# Patient Record
Sex: Female | Born: 1955 | Race: Black or African American | Hispanic: No | Smoking: Never smoker
Health system: Southern US, Community
[De-identification: ages and names within clinical notes are randomized; demographics above are authoritative.]

## PROBLEM LIST (undated history)

## (undated) DIAGNOSIS — G939 Disorder of brain, unspecified: Secondary | ICD-10-CM

## (undated) DIAGNOSIS — F4310 Post-traumatic stress disorder, unspecified: Secondary | ICD-10-CM

## (undated) DIAGNOSIS — M1990 Unspecified osteoarthritis, unspecified site: Secondary | ICD-10-CM

## (undated) HISTORY — PX: TUBAL LIGATION: SHX77

## (undated) HISTORY — PX: KNEE SURGERY: SHX244

---

## 2007-06-05 ENCOUNTER — Emergency Department (HOSPITAL_COMMUNITY): Admission: EM | Admit: 2007-06-05 | Discharge: 2007-06-05 | Payer: Self-pay | Admitting: Emergency Medicine

## 2007-06-07 ENCOUNTER — Emergency Department (HOSPITAL_COMMUNITY): Admission: EM | Admit: 2007-06-07 | Discharge: 2007-06-07 | Payer: Self-pay | Admitting: Emergency Medicine

## 2007-07-08 ENCOUNTER — Emergency Department (HOSPITAL_COMMUNITY): Admission: EM | Admit: 2007-07-08 | Discharge: 2007-07-08 | Payer: Self-pay | Admitting: Family Medicine

## 2009-05-26 ENCOUNTER — Emergency Department (HOSPITAL_COMMUNITY): Admission: EM | Admit: 2009-05-26 | Discharge: 2009-05-26 | Payer: Self-pay | Admitting: Emergency Medicine

## 2009-11-02 ENCOUNTER — Emergency Department (HOSPITAL_COMMUNITY): Admission: EM | Admit: 2009-11-02 | Discharge: 2009-11-02 | Payer: Self-pay | Admitting: Emergency Medicine

## 2010-04-05 ENCOUNTER — Emergency Department (HOSPITAL_COMMUNITY)
Admission: EM | Admit: 2010-04-05 | Discharge: 2010-04-05 | Payer: Self-pay | Source: Home / Self Care | Admitting: Emergency Medicine

## 2011-01-17 LAB — I-STAT 8, (EC8 V) (CONVERTED LAB)
BUN: 7
Chloride: 104
Glucose, Bld: 113 — ABNORMAL HIGH
Hemoglobin: 15.6 — ABNORMAL HIGH
Potassium: 3.7
Sodium: 137
TCO2: 30

## 2011-01-17 LAB — CBC
Hemoglobin: 13.2
RBC: 5.02
WBC: 7.1

## 2011-01-17 LAB — POCT I-STAT CREATININE
Creatinine, Ser: 1.1
Operator id: 235561

## 2011-01-17 LAB — DIFFERENTIAL
Lymphocytes Relative: 22
Lymphs Abs: 1.5
Monocytes Absolute: 0.6
Monocytes Relative: 9
Neutro Abs: 4.7
Neutrophils Relative %: 66

## 2011-01-17 LAB — INFLUENZA A AND B ANTIGEN (CONVERTED LAB): Inflenza A Ag: NEGATIVE

## 2011-09-05 ENCOUNTER — Emergency Department (HOSPITAL_COMMUNITY)
Admission: EM | Admit: 2011-09-05 | Discharge: 2011-09-05 | Disposition: A | Discharge status: Home / Self Care | Payer: Medicare Other | Attending: Emergency Medicine | Admitting: Emergency Medicine

## 2011-09-05 ENCOUNTER — Encounter (HOSPITAL_COMMUNITY): Payer: Self-pay | Admitting: Emergency Medicine

## 2011-09-05 DIAGNOSIS — R110 Nausea: Secondary | ICD-10-CM | POA: Insufficient documentation

## 2011-09-05 DIAGNOSIS — G43909 Migraine, unspecified, not intractable, without status migrainosus: Secondary | ICD-10-CM | POA: Insufficient documentation

## 2011-09-05 DIAGNOSIS — G939 Disorder of brain, unspecified: Secondary | ICD-10-CM | POA: Insufficient documentation

## 2011-09-05 HISTORY — DX: Disorder of brain, unspecified: G93.9

## 2011-09-05 HISTORY — DX: Unspecified osteoarthritis, unspecified site: M19.90

## 2011-09-05 MED ORDER — METOCLOPRAMIDE HCL 5 MG/ML IJ SOLN
10.0000 mg | Freq: Once | INTRAMUSCULAR | Status: AC
Start: 2011-09-05 — End: 2011-09-05
  Administered 2011-09-05: 10 mg via INTRAMUSCULAR
  Filled 2011-09-05: qty 2

## 2011-09-05 MED ORDER — DEXAMETHASONE SODIUM PHOSPHATE 4 MG/ML IJ SOLN
10.0000 mg | Freq: Once | INTRAMUSCULAR | Status: AC
  Administered 2011-09-05: 10 mg via INTRAMUSCULAR
  Filled 2011-09-05: qty 1

## 2011-09-05 MED ORDER — DIPHENHYDRAMINE HCL 50 MG/ML IJ SOLN
50.0000 mg | Freq: Once | INTRAMUSCULAR | Status: AC
  Administered 2011-09-05: 50 mg via INTRAMUSCULAR
  Filled 2011-09-05: qty 1

## 2011-09-05 NOTE — ED Provider Notes (Signed)
History   This chart was scribed for Laray Anger, DO by Melba Coon. The patient was seen in room STRE7/STRE7 and the patient's care was started at 1335.   CSN: [id number]  Arrival date & time 09/05/11  1229   None     Chief Complaint  Patient presents with  . Headache    HPI Carmen Farrell is a 56 y.o. female who presents to the Emergency Department complaining of gradual onset and persistence of constant acute flair of her chronic right sided migraine headache for the past 3 days.  Describes the headache as per her usual chronic migraine headache pain pattern for the past 4-5 years.  Has been assoc with nausea.  Denies headache was sudden or maximal in onset or at any time.  Denies visual changes, no focal motor weakness, no tingling/numbness in extremities, no fevers, no neck pain, no rash.     Past Medical History  Diagnosis Date  . Migraine   . Arthritis   . Brain lesion     History reviewed. No pertinent past surgical history.   History  Substance Use Topics  . Smoking status: Never Smoker   . Smokeless tobacco: Not on file  . Alcohol Use: No      Review of Systems 10 Systems reviewed and all are negative for acute change except as noted in the HPI. ROS: Statement: All systems negative except as marked or noted in the HPI; Constitutional: Negative for fever and chills. ; ; Eyes: Negative for eye pain, redness and discharge. ; ; ENMT: Negative for ear pain, hoarseness, nasal congestion, sinus pressure and sore throat. ; ; Cardiovascular: Negative for chest pain, palpitations, diaphoresis, dyspnea and peripheral edema. ; ; Respiratory: Negative for cough, wheezing and stridor. ; ; Gastrointestinal: +nausea. Negative for vomiting, diarrhea, abdominal pain, blood in stool, hematemesis, jaundice and rectal bleeding. . ; ; Genitourinary: Negative for dysuria, flank pain and hematuria. ; ; Musculoskeletal: Negative for back pain and neck pain. Negative for  swelling and trauma.; ; Skin: Negative for pruritus, rash, abrasions, blisters, bruising and skin lesion.; ; Neuro: +headache. Negative for lightheadedness and neck stiffness. Negative for weakness, altered level of consciousness , altered mental status, extremity weakness, paresthesias, involuntary movement, seizure and syncope.       Allergies  Talwin and Penicillins  Home Medications   Current Outpatient Rx  Name Route Sig Dispense Refill  . CETIRIZINE HCL 10 MG PO TABS Oral Take 10 mg by mouth daily.    Marland Kitchen VITAMIN D 1000 UNITS PO TABS Oral Take 1,000 Units by mouth daily.    Marland Kitchen FLUTICASONE PROPIONATE 50 MCG/ACT NA SUSP Nasal Place 1 spray into the nose daily.    Marland Kitchen GABAPENTIN 800 MG PO TABS Oral Take 800 mg by mouth 3 (three) times daily.    Marland Kitchen GARLIC PO Oral Take 1 capsule by mouth daily.    . OMEGA-3-ACID ETHYL ESTERS 1 G PO CAPS Oral Take 1 g by mouth daily.    . IMITREX PO Oral Take by mouth.      BP 118/84  Pulse 72  Temp(Src) 98.3 F (36.8 C) (Oral)  Resp 20  SpO2 98%  Physical Exam 1340: Physical examination:  Nursing notes reviewed; Vital signs and O2 SAT reviewed;  Constitutional: Well developed, Well nourished, Well hydrated, In no acute distress; Head:  Normocephalic, atraumatic; Eyes: EOMI, PERRL, No scleral icterus; ENMT: TM's clear, +edemetous nasal turbinates bilat with clear rhinorrhea. Mouth and pharynx normal, Mucous membranes  moist; Neck: Supple, Full range of motion, No lymphadenopathy; Cardiovascular: Regular rate and rhythm, No murmur, rub, or gallop; Respiratory: Breath sounds clear & equal bilaterally, No rales, rhonchi, wheezes, or rub, Normal respiratory effort/excursion; Chest: Nontender, Movement normal; Extremities: Pulses normal, No tenderness, No edema, No calf edema or asymmetry.; Neuro: AA&Ox3, Major CN grossly intact. Speech clear, no facial droop.  Strength 5/5 equal bilat UE's and LE's.  DTR 2/4 equal bilat UE's and LE's.  No gross sensory deficits.   Normal coordination bilat UE's and LE's. No nystagmus. Gait steady.; Skin: Color normal, Warm, Dry, no rash.    ED Course  Procedures   MDM  MDM Reviewed: previous chart, nursing note and vitals    1:57 PM:  Has hx chronic right sided forehead headaches, has been eval in ED previously for same.  Endorses she also has been extensively eval by her PMD and Neuro MD for same.  Describes the pain as being "above my eye," denies specific eye or vision complaints.  Denies any change in her usual chronic pain pattern today.  Will tx symptomatically.      I personally performed the services described in this documentation, which was scribed in my presence. The recorded information has been reviewed and considered. Gavino Fouch Allison Quarry, DO 09/06/11 2130

## 2011-09-05 NOTE — Discharge Instructions (Signed)
RESOURCE GUIDE  Dental Problems  Patients with Medicaid: [facility]                     [facility] [address]                                           [address] Phone:  [phone]                                                  Phone:  [phone]  If unable to pay or uninsured, contact:  [facility] or [facility] to become qualified for the adult dental clinic.  Chronic Pain Problems Contact [facility]  [phone] Patients need to be referred by their primary care doctor.  Insufficient Money for Medicine Contact [employer]:  call "211" or [facility] 271-5999.  No Primary Care Doctor Call [facility]  832-8000 Other agencies that provide inexpensive medical care    [facility]  [phone]    [facility]  [phone]    [facility]  [phone]    Women[facility] Clinic  [phone]    [facility]  [phone]    [facility]  [phone]  Psychological Services [facility]  [phone] [facility]  [phone] [facility]   [phone] (emergency services [phone])  Substance Abuse Resources [facility]  336-882-2125 [facility] 336-784-9470 [facility] 336-285-9073 [facility] 336-845-3988 Residential & Outpatient Substance Abuse Program  800-659-3381  Abuse/Neglect [place] Child Abuse Hotline (336) 641-3795 [place] Child Abuse Hotline 800-378-5315 (After Hours)  Emergency Shelter [facility] (336) 271-5985  Maternity Homes [facility] (336) 275-9566 [facility] (704) 372-4663  MRSA Hotline #:   832-7006    [place] [facility]  [facility] [place]     [employer]                          [facility] [address] [place]                       [address]      [address]  [place]                                                [place]                            [place] Phone:  [phone]                                   Phone:  [phone]                 Phone:  [phone]  [facility] Phone:  [phone]    [facility] Child Abuse Hotline 409-163-2835 4380657311 (After Hours)   Take your usual prescriptions as previously directed.  Keep a diary of your headaches and show it to your doctor at your next follow up visit.  Call your regular medical doctor and your Neurologist today to schedule a follow up appointment within the next week.  Return to the Emergency Department immediately sooner if worsening.

## 2011-09-05 NOTE — ED Notes (Signed)
Pt c/o left sided HA with blurry vision and nausea x 3 days; pt sts hx of same in past

## 2011-12-31 ENCOUNTER — Encounter (HOSPITAL_COMMUNITY): Payer: Self-pay | Admitting: Emergency Medicine

## 2011-12-31 ENCOUNTER — Emergency Department (HOSPITAL_COMMUNITY)
Admission: EM | Admit: 2011-12-31 | Discharge: 2012-01-01 | Disposition: A | Discharge status: Home / Self Care | Payer: Medicare Other | Attending: Emergency Medicine | Admitting: Emergency Medicine

## 2011-12-31 DIAGNOSIS — T148XXA Other injury of unspecified body region, initial encounter: Secondary | ICD-10-CM | POA: Insufficient documentation

## 2011-12-31 DIAGNOSIS — X58XXXA Exposure to other specified factors, initial encounter: Secondary | ICD-10-CM | POA: Insufficient documentation
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
## 2012-01-29 ENCOUNTER — Emergency Department (HOSPITAL_COMMUNITY): Payer: No Typology Code available for payment source

## 2012-01-29 ENCOUNTER — Encounter (HOSPITAL_COMMUNITY): Payer: Self-pay

## 2012-01-29 ENCOUNTER — Emergency Department (HOSPITAL_COMMUNITY)
Admission: EM | Admit: 2012-01-29 | Discharge: 2012-01-29 | Disposition: A | Discharge status: Home / Self Care | Payer: No Typology Code available for payment source | Attending: Emergency Medicine | Admitting: Emergency Medicine

## 2012-01-29 DIAGNOSIS — F4310 Post-traumatic stress disorder, unspecified: Secondary | ICD-10-CM | POA: Insufficient documentation

## 2012-01-29 DIAGNOSIS — M25569 Pain in unspecified knee: Secondary | ICD-10-CM | POA: Insufficient documentation

## 2012-01-29 DIAGNOSIS — Z79899 Other long term (current) drug therapy: Secondary | ICD-10-CM | POA: Insufficient documentation

## 2012-01-29 DIAGNOSIS — IMO0002 Reserved for concepts with insufficient information to code with codable children: Secondary | ICD-10-CM | POA: Insufficient documentation

## 2012-01-29 DIAGNOSIS — M25529 Pain in unspecified elbow: Secondary | ICD-10-CM | POA: Insufficient documentation

## 2012-01-29 DIAGNOSIS — M25559 Pain in unspecified hip: Secondary | ICD-10-CM | POA: Insufficient documentation

## 2012-01-29 DIAGNOSIS — G43909 Migraine, unspecified, not intractable, without status migrainosus: Secondary | ICD-10-CM | POA: Insufficient documentation

## 2012-01-29 HISTORY — DX: Post-traumatic stress disorder, unspecified: F43.10

## 2012-01-29 MED ORDER — HYDROCODONE-ACETAMINOPHEN 5-325 MG PO TABS
1.0000 | ORAL_TABLET | Freq: Once | ORAL | Status: AC
  Administered 2012-01-29: 1 via ORAL
  Filled 2012-01-29: qty 1

## 2012-01-29 MED ORDER — KETOROLAC TROMETHAMINE 60 MG/2ML IM SOLN
60.0000 mg | Freq: Once | INTRAMUSCULAR | Status: AC
  Administered 2012-01-29: 60 mg via INTRAMUSCULAR
  Filled 2012-01-29: qty 2

## 2012-01-29 MED ORDER — TRAMADOL HCL 50 MG PO TABS: 50.0000 mg | ORAL_TABLET | Freq: Four times a day (QID) | ORAL | Status: DC | PRN

## 2012-01-29 MED ORDER — NAPROXEN 500 MG PO TABS: 500.0000 mg | ORAL_TABLET | Freq: Two times a day (BID) | ORAL | Status: DC

## 2012-01-29 MED ORDER — LORAZEPAM 1 MG PO TABS
1.0000 mg | ORAL_TABLET | Freq: Once | ORAL | Status: AC
  Administered 2012-01-29: 1 mg via ORAL
  Filled 2012-01-29: qty 1

## 2012-01-29 NOTE — ED Notes (Signed)
Patient transported to X-ray 

## 2012-01-29 NOTE — ED Provider Notes (Signed)
Medical screening examination/treatment/procedure(s) were performed by non-physician practitioner and as supervising physician I was immediately available for consultation/collaboration.   [place] Skalicky, MD 01/29/12 2335 

## 2012-01-29 NOTE — ED Notes (Signed)
Ortho tech notified.  

## 2012-01-29 NOTE — ED Notes (Signed)
Patient unable to ambulate independently  
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
## 2012-01-30 ENCOUNTER — Encounter (HOSPITAL_COMMUNITY): Payer: Self-pay | Admitting: Emergency Medicine

## 2012-01-30 ENCOUNTER — Emergency Department (HOSPITAL_COMMUNITY)
Admission: EM | Admit: 2012-01-30 | Discharge: 2012-01-30 | Disposition: A | Discharge status: Home / Self Care | Payer: Medicare Other | Attending: Emergency Medicine | Admitting: Emergency Medicine

## 2012-01-30 DIAGNOSIS — Z043 Encounter for examination and observation following other accident: Secondary | ICD-10-CM | POA: Insufficient documentation

## 2012-01-30 DIAGNOSIS — M129 Arthropathy, unspecified: Secondary | ICD-10-CM | POA: Insufficient documentation

## 2012-01-30 DIAGNOSIS — M25562 Pain in left knee: Secondary | ICD-10-CM

## 2012-01-30 DIAGNOSIS — Z79899 Other long term (current) drug therapy: Secondary | ICD-10-CM | POA: Insufficient documentation

## 2012-01-30 DIAGNOSIS — F4310 Post-traumatic stress disorder, unspecified: Secondary | ICD-10-CM | POA: Insufficient documentation

## 2012-01-30 MED ORDER — OXYCODONE-ACETAMINOPHEN 5-325 MG PO TABS: 1.0000 | ORAL_TABLET | Freq: Four times a day (QID) | ORAL | Status: DC | PRN

## 2012-01-30 NOTE — Discharge Instructions (Signed)
Please make sure to follow up with an orthopedist as discussed.  Return for any concerning changes in your condition
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
## 2012-01-30 NOTE — ED Notes (Signed)
Was seen yesterday and given meds but she needs more has leg and hip pain

## 2012-07-20 ENCOUNTER — Emergency Department (HOSPITAL_COMMUNITY)
Admission: EM | Admit: 2012-07-20 | Discharge: 2012-07-20 | Disposition: A | Discharge status: Home / Self Care | Payer: No Typology Code available for payment source | Attending: Emergency Medicine | Admitting: Emergency Medicine

## 2012-07-20 DIAGNOSIS — Z8739 Personal history of other diseases of the musculoskeletal system and connective tissue: Secondary | ICD-10-CM | POA: Insufficient documentation

## 2012-07-20 DIAGNOSIS — R112 Nausea with vomiting, unspecified: Secondary | ICD-10-CM | POA: Insufficient documentation

## 2012-07-20 DIAGNOSIS — Z8659 Personal history of other mental and behavioral disorders: Secondary | ICD-10-CM | POA: Insufficient documentation

## 2012-07-20 DIAGNOSIS — Z9889 Other specified postprocedural states: Secondary | ICD-10-CM | POA: Insufficient documentation

## 2012-07-20 DIAGNOSIS — Z87828 Personal history of other (healed) physical injury and trauma: Secondary | ICD-10-CM | POA: Insufficient documentation

## 2012-07-20 DIAGNOSIS — Z8669 Personal history of other diseases of the nervous system and sense organs: Secondary | ICD-10-CM | POA: Insufficient documentation

## 2012-07-20 DIAGNOSIS — Z79899 Other long term (current) drug therapy: Secondary | ICD-10-CM | POA: Insufficient documentation

## 2012-07-20 DIAGNOSIS — R748 Abnormal levels of other serum enzymes: Secondary | ICD-10-CM

## 2012-07-20 DIAGNOSIS — G43909 Migraine, unspecified, not intractable, without status migrainosus: Secondary | ICD-10-CM

## 2012-07-20 LAB — URINALYSIS, ROUTINE W REFLEX MICROSCOPIC
Glucose, UA: NEGATIVE mg/dL
Protein, ur: NEGATIVE mg/dL
Specific Gravity, Urine: 1.019 (ref 1.005–1.030)
Urobilinogen, UA: 0.2 mg/dL (ref 0.0–1.0)

## 2012-07-20 LAB — CBC WITH DIFFERENTIAL/PLATELET
Basophils Absolute: 0 10*3/uL (ref 0.0–0.1)
Basophils Relative: 0 % (ref 0–1)
Eosinophils Absolute: 0.1 10*3/uL (ref 0.0–0.7)
Eosinophils Relative: 1 % (ref 0–5)
HCT: 36.7 % (ref 36.0–46.0)
Lymphocytes Relative: 21 % (ref 12–46)
MCH: 25.6 pg — ABNORMAL LOW (ref 26.0–34.0)
MCHC: 31.9 g/dL (ref 30.0–36.0)
MCV: 80.3 fL (ref 78.0–100.0)
Monocytes Absolute: 0.7 10*3/uL (ref 0.1–1.0)
Platelets: 347 10*3/uL (ref 150–400)
RDW: 15.7 % — ABNORMAL HIGH (ref 11.5–15.5)

## 2012-07-20 LAB — COMPREHENSIVE METABOLIC PANEL
AST: 20 U/L (ref 0–37)
CO2: 27 mEq/L (ref 19–32)
Calcium: 9.2 mg/dL (ref 8.4–10.5)
Creatinine, Ser: 0.68 mg/dL (ref 0.50–1.10)
GFR calc non Af Amer: >90 mL/min (ref >90)
Total Protein: 8 g/dL (ref 6.0–8.3)

## 2012-07-20 LAB — URINE MICROSCOPIC-ADD ON

## 2012-07-20 MED ORDER — ONDANSETRON 8 MG PO TBDP: 8.0000 mg | ORAL_TABLET | Freq: Three times a day (TID) | ORAL | Status: DC | PRN

## 2012-07-20 MED ORDER — METOCLOPRAMIDE HCL 5 MG/ML IJ SOLN
10.0000 mg | Freq: Once | INTRAMUSCULAR | Status: AC
  Administered 2012-07-20: 10 mg via INTRAVENOUS
  Filled 2012-07-20: qty 2

## 2012-07-20 MED ORDER — OXYCODONE-ACETAMINOPHEN 5-325 MG PO TABS: 1.0000 | ORAL_TABLET | Freq: Four times a day (QID) | ORAL | Status: DC | PRN

## 2012-07-20 MED ORDER — HYDROMORPHONE HCL PF 1 MG/ML IJ SOLN
1.0000 mg | Freq: Once | INTRAMUSCULAR | Status: AC
  Administered 2012-07-20: 1 mg via INTRAVENOUS
  Filled 2012-07-20: qty 1

## 2012-07-20 MED ORDER — SODIUM CHLORIDE 0.9 % IV BOLUS (SEPSIS)
1000.0000 mL | Freq: Once | INTRAVENOUS | Status: AC
  Administered 2012-07-20: 1000 mL via INTRAVENOUS

## 2012-07-20 MED ORDER — DIPHENHYDRAMINE HCL 50 MG/ML IJ SOLN
12.5000 mg | Freq: Once | INTRAMUSCULAR | Status: AC
  Administered 2012-07-20: 12.5 mg via INTRAVENOUS
  Filled 2012-07-20: qty 1

## 2012-07-20 NOTE — ED Notes (Signed)
Pt states that she has had a HA since this morning w/ n/v. 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 Unable to keep imitrex down. Also had knee arthoscopy last week.
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
## 2012-07-20 NOTE — ED Notes (Signed)
[id number]<AV> Expected date:07/20/12<BR> Expected time: 7:49 PM<BR> Means of arrival:Ambulance<BR> Comments:<BR> headache

## 2013-02-07 ENCOUNTER — Encounter (HOSPITAL_COMMUNITY): Payer: Self-pay | Admitting: Emergency Medicine

## 2013-02-07 ENCOUNTER — Emergency Department (HOSPITAL_COMMUNITY): Payer: Non-veteran care

## 2013-02-07 ENCOUNTER — Emergency Department (HOSPITAL_COMMUNITY)
Admission: EM | Admit: 2013-02-07 | Discharge: 2013-02-07 | Disposition: A | Discharge status: Home / Self Care | Payer: Non-veteran care | Attending: Emergency Medicine | Admitting: Emergency Medicine

## 2013-02-07 DIAGNOSIS — J069 Acute upper respiratory infection, unspecified: Secondary | ICD-10-CM | POA: Insufficient documentation

## 2013-02-07 DIAGNOSIS — R05 Cough: Secondary | ICD-10-CM | POA: Diagnosis present

## 2013-02-07 DIAGNOSIS — J029 Acute pharyngitis, unspecified: Secondary | ICD-10-CM | POA: Insufficient documentation

## 2013-02-07 DIAGNOSIS — R112 Nausea with vomiting, unspecified: Secondary | ICD-10-CM | POA: Diagnosis not present

## 2013-02-07 DIAGNOSIS — G43909 Migraine, unspecified, not intractable, without status migrainosus: Secondary | ICD-10-CM | POA: Insufficient documentation

## 2013-02-07 DIAGNOSIS — Z79899 Other long term (current) drug therapy: Secondary | ICD-10-CM | POA: Insufficient documentation

## 2013-02-07 DIAGNOSIS — Z8739 Personal history of other diseases of the musculoskeletal system and connective tissue: Secondary | ICD-10-CM | POA: Insufficient documentation

## 2013-02-07 DIAGNOSIS — Z8659 Personal history of other mental and behavioral disorders: Secondary | ICD-10-CM | POA: Insufficient documentation

## 2013-02-07 DIAGNOSIS — IMO0002 Reserved for concepts with insufficient information to code with codable children: Secondary | ICD-10-CM | POA: Insufficient documentation

## 2013-02-07 DIAGNOSIS — Z880 Allergy status to penicillin: Secondary | ICD-10-CM | POA: Insufficient documentation

## 2013-02-07 MED ORDER — DOXYCYCLINE HYCLATE 100 MG PO CAPS: 100.0000 mg | ORAL_CAPSULE | Freq: Two times a day (BID) | ORAL | Status: AC

## 2013-02-07 MED ORDER — DIPHENHYDRAMINE HCL 50 MG/ML IJ SOLN
25.0000 mg | Freq: Once | INTRAMUSCULAR | Status: AC
  Administered 2013-02-07: 25 mg via INTRAVENOUS
  Filled 2013-02-07: qty 1

## 2013-02-07 MED ORDER — DEXAMETHASONE SODIUM PHOSPHATE 10 MG/ML IJ SOLN
10.0000 mg | Freq: Once | INTRAMUSCULAR | Status: AC
  Administered 2013-02-07: 10 mg via INTRAVENOUS
  Filled 2013-02-07: qty 1

## 2013-02-07 MED ORDER — METOCLOPRAMIDE HCL 5 MG/ML IJ SOLN
10.0000 mg | Freq: Once | INTRAMUSCULAR | Status: AC
  Administered 2013-02-07: 10 mg via INTRAVENOUS
  Filled 2013-02-07: qty 2

## 2013-02-07 MED ORDER — SODIUM CHLORIDE 0.9 % IV BOLUS (SEPSIS)
1000.0000 mL | Freq: Once | INTRAVENOUS | Status: AC
  Administered 2013-02-07: 1000 mL via INTRAVENOUS

## 2013-02-07 NOTE — ED Notes (Signed)
Pt sts she is ready to go, her HA has decreased enough for her.

## 2013-02-07 NOTE — ED Notes (Signed)
Pt in radiology 

## 2013-02-07 NOTE — ED Notes (Signed)
Pt reports about a week ago she started to have chills and then a small headache, wasn't too concern at first and tried to treat it at home, sts she also started to experience some wheezing and coughing up mucus. Pt c/o headache to front of head that has progressively gotten worse. sts she has a lesion in her brain and is scheduled to have an MRI done, but doesn't think this is related. Reports some sob, has taken neb treatments at home and been using an humidifier. Pt in nad, skin warm and dry, resp e/u.

## 2013-02-07 NOTE — ED Provider Notes (Signed)
CSN: [id number]     Arrival date & time 02/07/13  1102 History   First MD Initiated Contact with Patient 02/07/13 1152     Chief Complaint  Patient presents with  . Cough  . Wheezing   (Consider location/radiation/quality/duration/timing/severity/associated sxs/prior Treatment) HPI Comments: 57 y/o female with history of migraines presenting with 2 days of cough, headache, and subjective fever. Cough productive of green sputum. Headache in frontal region. No neurological complaints. Also reports nasal drainage and nausea.   The history is provided by the patient. No language interpreter was used.    Past Medical History  Diagnosis Date  . Migraine   . Arthritis   . Brain lesion   . PTSD (post-traumatic stress disorder)    Past Surgical History  Procedure Laterality Date  . Tubal ligation     No family history on file. History  Substance Use Topics  . Smoking status: Never Smoker   . Smokeless tobacco: Not on file  . Alcohol Use: No   OB History   Grav Para Term Preterm Abortions TAB SAB Ect Mult Living                 Review of Systems  Constitutional: Positive for fever, chills and appetite change.  HENT: Positive for congestion, sinus pressure and sore throat. Negative for trouble swallowing and voice change.   Respiratory: Positive for cough, chest tightness and wheezing.   Cardiovascular: Negative for chest pain and palpitations.  Gastrointestinal: Positive for nausea and vomiting. Negative for abdominal pain, diarrhea and abdominal distention.  Genitourinary: Negative for dysuria.  All other systems reviewed and are negative.    Allergies  Talwin; Hydrocodone-acetaminophen; Penicillins; and Tylenol  Home Medications   Current Outpatient Rx  Name  Route  Sig  Dispense  Refill  . albuterol (PROVENTIL HFA;VENTOLIN HFA) 108 (90 BASE) MCG/ACT inhaler   Inhalation   Inhale 2 puffs into the lungs every 6 (six) hours as needed. For shortness of breath          . albuterol-ipratropium (COMBIVENT) 18-103 MCG/ACT inhaler   Inhalation   Inhale 2 puffs into the lungs every 6 (six) hours as needed (wheezing).         . cetirizine (ZYRTEC) 10 MG tablet   Oral   Take 10 mg by mouth daily as needed. For allergies         . cholecalciferol (VITAMIN D) 1000 UNITS tablet   Oral   Take 1,000 Units by mouth daily.         Marland Kitchen etodolac (LODINE) 500 MG tablet   Oral   Take 500 mg by mouth 2 (two) times daily.         . fluticasone (FLONASE) 50 MCG/ACT nasal spray   Nasal   Place 1 spray into the nose daily.         . fluticasone (FLOVENT HFA) 44 MCG/ACT inhaler   Inhalation   Inhale 2 puffs into the lungs 2 (two) times daily.         Marland Kitchen gabapentin (NEURONTIN) 800 MG tablet   Oral   Take 800 mg by mouth 3 (three) times daily.         . metoprolol succinate (TOPROL-XL) 50 MG 24 hr tablet   Oral   Take 50 mg by mouth 2 (two) times daily. Take with or immediately following a meal.         . mometasone-formoterol (DULERA) 100-5 MCG/ACT AERO   Inhalation   Inhale 2  puffs into the lungs 2 (two) times daily.         . Multiple Vitamin (MULITIVITAMIN WITH MINERALS) TABS   Oral   Take 1 tablet by mouth daily.         Marland Kitchen omega-3 acid ethyl esters (LOVAZA) 1 G capsule   Oral   Take 1 g by mouth daily.         . ondansetron (ZOFRAN ODT) 8 MG disintegrating tablet   Oral   Take 1 tablet (8 mg total) by mouth every 8 (eight) hours as needed for nausea.   20 tablet   0   . oxyCODONE-acetaminophen (PERCOCET/ROXICET) 5-325 MG per tablet   Oral   Take 1 tablet by mouth every 6 (six) hours as needed for pain.   12 tablet   0   . oxyCODONE-acetaminophen (PERCOCET/ROXICET) 5-325 MG per tablet   Oral   Take 1-2 tablets by mouth every 6 (six) hours as needed for pain.   16 tablet   0   . propranolol (INDERAL) 10 MG tablet   Oral   Take 10 mg by mouth every morning.          . rosuvastatin (CRESTOR) 20 MG tablet   Oral    Take 10 mg by mouth at bedtime.         . SUMAtriptan (IMITREX) 50 MG tablet   Oral   Take 50 mg by mouth every 2 (two) hours as needed. For migraine          BP 124/94  Pulse 73  Temp(Src) 97.8 F (36.6 C) (Oral)  Resp 18  Wt 201 lb 4 oz (91.286 kg)  SpO2 99% Physical Exam  Vitals reviewed. Constitutional: She is oriented to person, place, and time. She appears well-developed and well-nourished. No distress.  HENT:  Head: Normocephalic.  Right Ear: Hearing, tympanic membrane and ear canal normal. No mastoid tenderness.  Left Ear: Hearing, tympanic membrane and ear canal normal. No mastoid tenderness.  Nose: Mucosal edema and rhinorrhea present. Left sinus exhibits maxillary sinus tenderness and frontal sinus tenderness.  Mouth/Throat: Uvula is midline and mucous membranes are normal. No trismus in the jaw. Posterior oropharyngeal erythema present. No tonsillar abscesses.  Submandibular region soft  Neck: Neck supple.  Cardiovascular: Normal rate, regular rhythm, normal heart sounds and intact distal pulses.   Pulmonary/Chest: Effort normal and breath sounds normal. She has no wheezes.  Abdominal: Soft. Bowel sounds are normal. She exhibits no distension. There is no tenderness.  Lymphadenopathy:    She has cervical adenopathy.  Neurological: She is alert and oriented to person, place, and time.  Skin: Skin is warm and dry.    ED Course  Procedures (including critical care time) Labs Review Labs Reviewed - No data to display Imaging Review Dg Chest 2 View  02/07/2013   CLINICAL DATA:  Cough and wheezing.  EXAM: CHEST  2 VIEW  COMPARISON:  04/05/2010  FINDINGS: The heart size and mediastinal contours are within normal limits. Both lungs are clear. The visualized skeletal structures are unremarkable.  IMPRESSION: No active cardiopulmonary disease.   Electronically Signed   By: Geanie Cooley M.D.   On: 02/07/2013 12:30    EKG Interpretation   None       MDM   1.  URI (upper respiratory infection)   2. Migraine    57 y/o female with history of migraines presenting with URI symptoms and migraine. HA similar to prior migraines and gradual onset. No fevers.  Left frontal and maxillary sinus tenderness with nasal drainage. Lungs clear. CXR without focal airspace disease. Migraine cocktail given with resolution of HA. Doubt CVA, SAH, dissection, temporal arteritis, glaucoma, meningitis. Also no evidence of systemic illness. Will treat for sinusitis with doxycycline as patient has PCN allergy. Appropriate for discharge with PCP f/u. Return precautions discussed and patient voiced understanding.   Labs and imaging reviewed in my medical decision making if ordered. Patient discussed with my attending, Dr. Terrence Dupont, MD 02/07/13 [phone]

## 2013-02-07 NOTE — ED Notes (Signed)
Pt states that she has been sick for the last 5 days with symptoms of severe head pain, coughing, wheezing, and reports she had fever for 2 days.  Pt is complaining of nausea with brown green sputum

## 2013-02-07 NOTE — ED Provider Notes (Signed)
I saw and evaluated the patient, reviewed the resident's note and I agree with the findings and plan.  Neg ER w/u.  Suspect migraine and URI.  VSS.  Normal neuro exam.    Darlys Gales, MD 02/07/13 2127

## 2013-05-12 ENCOUNTER — Emergency Department (HOSPITAL_COMMUNITY)
Admission: EM | Admit: 2013-05-12 | Discharge: 2013-05-12 | Discharge status: Against Medical Advice | Payer: Medicare Other | Attending: Emergency Medicine | Admitting: Emergency Medicine

## 2013-05-12 ENCOUNTER — Encounter (HOSPITAL_COMMUNITY): Payer: Self-pay | Admitting: Emergency Medicine

## 2013-05-12 DIAGNOSIS — G43909 Migraine, unspecified, not intractable, without status migrainosus: Secondary | ICD-10-CM | POA: Insufficient documentation

## 2013-05-12 NOTE — ED Notes (Signed)
Pt presents with 3 day h/o L temporal headache, pt has h/o migraines, has taken imitrex today which has not helped.  Pt also reports productive cough x 2 days with green phlegm.

## 2016-12-06 ENCOUNTER — Encounter (HOSPITAL_COMMUNITY): Payer: Self-pay | Admitting: Emergency Medicine

## 2016-12-06 ENCOUNTER — Emergency Department (HOSPITAL_COMMUNITY)
Admission: EM | Admit: 2016-12-06 | Discharge: 2016-12-06 | Disposition: A | Discharge status: Home / Self Care | Payer: Medicare Other | Attending: Emergency Medicine | Admitting: Emergency Medicine

## 2016-12-06 DIAGNOSIS — M79604 Pain in right leg: Secondary | ICD-10-CM | POA: Diagnosis present

## 2016-12-06 DIAGNOSIS — M79605 Pain in left leg: Secondary | ICD-10-CM

## 2016-12-06 MED ORDER — HYDROMORPHONE HCL 1 MG/ML IJ SOLN
1.0000 mg | Freq: Once | INTRAMUSCULAR | Status: AC
  Administered 2016-12-06: 1 mg via INTRAMUSCULAR
  Filled 2016-12-06: qty 1

## 2016-12-06 MED ORDER — OXYCODONE-ACETAMINOPHEN 5-325 MG PO TABS: 2.0000 | ORAL_TABLET | ORAL | 0 refills | Status: DC | PRN

## 2016-12-06 MED ORDER — KETOROLAC TROMETHAMINE 60 MG/2ML IM SOLN
60.0000 mg | Freq: Once | INTRAMUSCULAR | Status: AC
  Administered 2016-12-06: 60 mg via INTRAMUSCULAR
  Filled 2016-12-06: qty 2

## 2016-12-06 MED ORDER — PROMETHAZINE HCL 25 MG PO TABS: 25.0000 mg | ORAL_TABLET | ORAL | 0 refills | Status: DC | PRN

## 2016-12-06 NOTE — ED Triage Notes (Signed)
Pt reports hx of arthritis. PT usually wears compression garments. Yesterday began to have a flare, reports pain to bilateral hips and legs. Pt is tearful at triage. Ambulatory. Has bilateral knee braces on, and roll-walker.

## 2016-12-06 NOTE — ED Provider Notes (Signed)
[facility]-EMERGENCY DEPT Provider Note   CSN: [id number] Arrival date & time: 12/06/16  1125     History   Chief Complaint Chief Complaint  Patient presents with  . Hip Pain  . Leg Pain  . Arthritis    HPI Carmen Farrell is a 61 y.o. female.  The history is provided by the patient. No language interpreter was used.  Hip Pain  This is a recurrent problem. The current episode started more than 2 days ago. The problem occurs constantly. The problem has been gradually worsening. The symptoms are aggravated by walking. Nothing relieves the symptoms. She has tried a warm compress and acetaminophen for the symptoms. The treatment provided no relief.  Leg Pain    Arthritis   Pt complains of pain in both knees.  Pt reports she has arthritis from [employer].  Pt reports severe pain in both knees and her hip.  [facility].   Past Medical History:  Diagnosis Date  . Arthritis   . Brain lesion   . Migraine   . PTSD (post-traumatic stress disorder)     Patient Active Problem List   Diagnosis Date Noted  . Migraine   . Brain lesion     Past Surgical History:  Procedure Laterality Date  . TUBAL LIGATION      OB History    No data available       Home Medications    Prior to Admission medications   Medication Sig Start Date End Date Taking? Authorizing Provider  albuterol (PROVENTIL HFA;VENTOLIN HFA) 108 (90 BASE) MCG/ACT inhaler Inhale 1-2 puffs into the lungs every 6 (six) hours as needed for wheezing or shortness of breath.    [provider]  albuterol-ipratropium (COMBIVENT) 18-103 MCG/ACT inhaler Inhale 1-2 puffs into the lungs every 4 (four) hours as needed for wheezing or shortness of breath.    [provider]  cetirizine (ZYRTEC) 10 MG tablet Take 10 mg by mouth daily.    [provider]  Cholecalciferol 1000 UNITS TBDP Take 1 tablet by mouth daily.    [provider]  citalopram (CELEXA) 40 MG tablet Take 40  mg by mouth daily.    [provider]  etodolac (LODINE) 400 MG tablet Take 400 mg by mouth 2 (two) times daily.    [provider]  flunisolide (NASALIDE) 25 MCG/ACT (0.025%) SOLN Place 2 sprays into the nose 2 (two) times daily.    [provider]  oxyCODONE-acetaminophen (PERCOCET/ROXICET) 5-325 MG tablet Take 2 tablets by mouth every 4 (four) hours as needed for severe pain. 12/06/16   Elson Areas, PA-C  promethazine (PHENERGAN) 25 MG tablet Take 1 tablet (25 mg total) by mouth every 4 (four) hours as needed for nausea or vomiting. 12/06/16   Elson Areas, PA-C  propranolol (INDERAL) 10 MG tablet Take 10 mg by mouth 2 (two) times daily.    [provider]  SUMAtriptan (IMITREX) 50 MG tablet Take 50 mg by mouth every 2 (two) hours as needed for migraine or headache. May repeat in 2 hours if headache persists or recurs.    [provider]    Family History No family history on file.  Social History Social History  Substance Use Topics  . Smoking status: Never Smoker  . Smokeless tobacco: Not on file  . Alcohol use No     Allergies   Talwin [pentazocine]; Hydrocodone-acetaminophen; Penicillins; and Tylenol [acetaminophen]   Review of Systems Review of  Systems  Musculoskeletal: Positive for arthritis.  All other systems reviewed and are negative.    Physical Exam Updated Vital Signs BP 135/82   Pulse 80   Temp 98.7 F (37.1 C) (Oral)   Resp 18   SpO2 100%   Physical Exam  Constitutional: She appears well-developed and well-nourished. No distress.  HENT:  Head: Normocephalic and atraumatic.  Eyes: Conjunctivae are normal.  Neck: Neck supple.  Cardiovascular: Normal rate and regular rhythm.   No murmur heard. Pulmonary/Chest: Effort normal and breath sounds normal. No respiratory distress.  Abdominal: Soft. There is no tenderness.  Musculoskeletal: She exhibits no edema.  Tender bilat knees and bilat hips,     Neurological: She is alert.  Skin: Skin is warm and dry.  Psychiatric: She has a normal mood and affect.  Nursing note and vitals reviewed.    ED Treatments / Results  Labs (all labs ordered are listed, but only abnormal results are displayed) Labs Reviewed - No data to display  EKG  EKG Interpretation None       Radiology No results found.  Procedures Procedures (including critical care time)  Medications Ordered in ED Medications  ketorolac (TORADOL) injection 60 mg (60 mg Intramuscular Given 12/06/16 1300)  HYDROmorphone (DILAUDID) injection 1 mg (1 mg Intramuscular Given 12/06/16 1258)     Initial Impression / Assessment and Plan / ED Course  I have reviewed the triage vital signs and the nursing notes. Pertinent labs & imaging results that were available during my care of the patient were reviewed by me and considered in my medical decision making (see chart for details).     Pt given dilaudid and torodol.  I believe pt is having acute pain.  Pt understands ED can only provided limited treatment.  [facility] agrees [facility] Monday  Final Clinical Impressions(s) / ED Diagnoses   Final diagnoses:  Right leg pain  Left leg pain    New Prescriptions Discharge Medication List as of 12/06/2016 12:39 PM    START taking these medications   Details  oxyCODONE-acetaminophen (PERCOCET/ROXICET) 5-325 MG tablet Take 2 tablets by mouth every 4 (four) hours as needed for severe pain., Starting Sat 12/06/2016, Print    promethazine (PHENERGAN) 25 MG tablet Take 1 tablet (25 mg total) by mouth every 4 (four) hours as needed for nausea or vomiting., Starting Sat 12/06/2016, Print      An After Visit Summary was printed and given to the patient.    Elson AreasSofia, Kate Sweetman K, Cordelia Poche-C 12/06/16 1548    Doug SouJacubowitz, Sam, MD 12/06/16 [phone]

## 2016-12-06 NOTE — Discharge Instructions (Signed)
[facility] on Monday for pain management

## 2016-12-06 NOTE — ED Notes (Signed)
Patient refused wheelchair x2.

## 2017-03-15 ENCOUNTER — Emergency Department (HOSPITAL_COMMUNITY)
Admission: EM | Admit: 2017-03-15 | Discharge: 2017-03-16 | Disposition: A | Discharge status: Home / Self Care | Payer: Medicare Other | Attending: Emergency Medicine | Admitting: Emergency Medicine

## 2017-03-15 ENCOUNTER — Encounter (HOSPITAL_COMMUNITY): Payer: Self-pay

## 2017-03-15 ENCOUNTER — Emergency Department (HOSPITAL_COMMUNITY): Payer: Medicare Other

## 2017-03-15 DIAGNOSIS — Y929 Unspecified place or not applicable: Secondary | ICD-10-CM | POA: Diagnosis not present

## 2017-03-15 DIAGNOSIS — Y998 Other external cause status: Secondary | ICD-10-CM | POA: Insufficient documentation

## 2017-03-15 DIAGNOSIS — M25511 Pain in right shoulder: Secondary | ICD-10-CM | POA: Insufficient documentation

## 2017-03-15 DIAGNOSIS — Y939 Activity, unspecified: Secondary | ICD-10-CM | POA: Insufficient documentation

## 2017-03-15 DIAGNOSIS — Z79899 Other long term (current) drug therapy: Secondary | ICD-10-CM | POA: Diagnosis not present

## 2017-03-15 DIAGNOSIS — W19XXXA Unspecified fall, initial encounter: Secondary | ICD-10-CM | POA: Diagnosis not present

## 2017-03-15 MED ORDER — IBUPROFEN 400 MG PO TABS
400.0000 mg | ORAL_TABLET | Freq: Once | ORAL | Status: AC | PRN
  Administered 2017-03-15: 400 mg via ORAL
  Filled 2017-03-15: qty 1
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
## 2017-03-15 NOTE — ED Notes (Signed)
Pt refused recheck of vital signs in waiting are. She stated "Don't even come over here with that machine I don't want my vitals check. I just want my xray results so I can get out of here."

## 2017-03-16 MED ORDER — METHOCARBAMOL 500 MG PO TABS: 500.0000 mg | ORAL_TABLET | Freq: Two times a day (BID) | ORAL | 0 refills | Status: DC

## 2017-03-16 MED ORDER — NAPROXEN 500 MG PO TABS: 500.0000 mg | ORAL_TABLET | Freq: Two times a day (BID) | ORAL | 0 refills | Status: DC

## 2017-03-16 MED ORDER — KETOROLAC TROMETHAMINE 60 MG/2ML IM SOLN
60.0000 mg | Freq: Once | INTRAMUSCULAR | Status: AC
  Administered 2017-03-16: 60 mg via INTRAMUSCULAR
  Filled 2017-03-16: qty 2

## 2017-03-16 MED ORDER — METHOCARBAMOL 500 MG PO TABS
500.0000 mg | ORAL_TABLET | Freq: Once | ORAL | Status: AC
  Administered 2017-03-16: 500 mg via ORAL
  Filled 2017-03-16: qty 1

## 2017-03-16 NOTE — Discharge Instructions (Signed)
1. Medications: alternate naprosyn and tylenol for pain control, Use Robaxin for muscle spasms, usual home medications 2. Treatment: rest, ice, elevate and use sling, drink plenty of fluids, gentle stretching 3. Follow Up: Please followup with orthopedics as directed or your PCP in 1 week if no improvement for discussion of your diagnoses and further evaluation after today's visit; if you do not have a primary care doctor use the resource guide provided to find one; Please return to the ER for worsening symptoms or other concerns

## 2017-03-16 NOTE — ED Notes (Signed)
Pt refusing to put gown on, allow VS to be taken or get in the bed.

## 2017-03-16 NOTE — ED Provider Notes (Signed)
[facility]   CSN: [id number] Arrival date & time: 03/15/17  [id number]     History   Chief Complaint Chief Complaint  Patient presents with  . Shoulder Injury    HPI Carmen Farrell is a 61 y.o. female with a hx of migraine, arthritis, PTSD presents to the Emergency Department complaining of, persistent right shoulder pain after fall early this morning.  Patient reports she has significant arthritis in her knee and it occasionally gives out when it is hurting.  She reports a cold in the last several weeks has caused her knee to hurt more often.  She denies hitting her head or loss of consciousness.  She reports that since that time her shoulder has been significantly sore.  She reports it is too painful to move.  She took Tylenol at home without relief.  She denies numbness or tingling.  Nothing seems to make the symptoms better.  The history is provided by the patient and medical records. No language interpreter was used.    Past Medical History:  Diagnosis Date  . Arthritis   . Brain lesion   . Migraine   . PTSD (post-traumatic stress disorder)     Patient Active Problem List   Diagnosis Date Noted  . Migraine   . Brain lesion     Past Surgical History:  Procedure Laterality Date  . TUBAL LIGATION      OB History    No data available       Home Medications    Prior to Admission medications   Medication Sig Start Date End Date Taking? Authorizing Provider  albuterol (PROVENTIL HFA;VENTOLIN HFA) 108 (90 BASE) MCG/ACT inhaler Inhale 1-2 puffs into the lungs every 6 (six) hours as needed for wheezing or shortness of breath.   Yes [provider]  albuterol-ipratropium (COMBIVENT) 18-103 MCG/ACT inhaler Inhale 1-2 puffs into the lungs every 4 (four) hours as needed for wheezing or shortness of breath.   Yes [provider]  cetirizine (ZYRTEC) 10 MG tablet Take 10 mg by mouth daily.   Yes  [provider]  Cholecalciferol 1000 UNITS TBDP Take 1 tablet by mouth daily.   Yes [provider]  etodolac (LODINE) 400 MG tablet Take 400 mg by mouth 2 (two) times daily.   Yes [provider]  flunisolide (NASALIDE) 25 MCG/ACT (0.025%) SOLN Place 2 sprays into the nose 2 (two) times daily.   Yes [provider]  SUMAtriptan (IMITREX) 50 MG tablet Take 50 mg by mouth every 2 (two) hours as needed for migraine or headache. May repeat in 2 hours if headache persists or recurs.   Yes [provider]  citalopram (CELEXA) 40 MG tablet Take 40 mg by mouth daily.    [provider]  methocarbamol (ROBAXIN) 500 MG tablet Take 1 tablet (500 mg total) 2 (two) times daily by mouth. 03/16/17   Mareena Cavan, Dahlia ClientHannah, PA-C  naproxen (NAPROSYN) 500 MG tablet Take 1 tablet (500 mg total) 2 (two) times daily with a meal by mouth. 03/16/17   Wayne Wicklund, Dahlia ClientHannah, PA-C  oxyCODONE-acetaminophen (PERCOCET/ROXICET) 5-325 MG tablet Take 2 tablets by mouth every 4 (four) hours as needed for severe pain. Patient not taking: Reported on 03/16/2017 12/06/16   Elson AreasSofia, Leslie K, PA-C  promethazine (PHENERGAN) 25 MG tablet Take 1 tablet (25 mg total) by mouth every 4 (four) hours as needed for nausea or vomiting. Patient not taking: Reported on 03/16/2017 12/06/16   Keenan BachelorSofia,  Lonia SkinnerLeslie K, PA-C    Family History History reviewed. No pertinent family history.  Social History Social History   Tobacco Use  . Smoking status: Never Smoker  . Smokeless tobacco: Never Used  Substance Use Topics  . Alcohol use: No  . Drug use: No     Allergies   Talwin [pentazocine]; Hydrocodone-acetaminophen; Penicillins; and Tylenol [acetaminophen]   Review of Systems Review of Systems  Constitutional: Negative for chills and fever.  Gastrointestinal: Negative for nausea and vomiting.  Musculoskeletal: Positive for arthralgias and joint swelling. Negative for back pain, neck pain and  neck stiffness.  Skin: Negative for wound.  Neurological: Negative for numbness.  Hematological: Does not bruise/bleed easily.  Psychiatric/Behavioral: The patient is not nervous/anxious.   All other systems reviewed and are negative.    Physical Exam Updated Vital Signs BP (!) 136/93   Pulse 65   Temp 97.7 F (36.5 C) (Oral)   Resp 16   Ht 5\' 9"  (1.753 m)   Wt 86.2 kg (190 lb)   SpO2 100%   BMI 28.06 kg/m   Physical Exam  Constitutional: She appears well-developed and well-nourished. No distress.  HENT:  Head: Normocephalic and atraumatic.  Eyes: Conjunctivae are normal.  Neck: Normal range of motion.  Full range of motion without pain No midline or paraspinal tenderness  Cardiovascular: Normal rate, regular rhythm and intact distal pulses.  Capillary refill < 3 sec  Pulmonary/Chest: Effort normal and breath sounds normal.  Musculoskeletal: She exhibits tenderness. She exhibits no edema.  Full range of motion of the T-spine and L-spine without pain.  No midline or paraspinal tenderness.  Right shoulder: No palpable or visible deformity.  Patient with tenderness to palpation along the distal clavicle, AC joint and posterior trapezius.  Minimal range of motion of the right shoulder.  Full range of motion of the elbow, wrist and fingers.  5/5 grip strength bilaterally.  Sensation intact to the right upper extremity.  Neurological: She is alert. Coordination normal.  Skin: Skin is warm and dry. She is not diaphoretic.  No tenting of the skin  Psychiatric: She has a normal mood and affect.  Nursing note and vitals reviewed.    ED Treatments / Results   Radiology Dg Shoulder Right  Result Date: 03/15/2017 CLINICAL DATA:  61 y/o  F; fall with right shoulder pain. EXAM: RIGHT SHOULDER - 2+ VIEW COMPARISON:  04/05/2010 right shoulder radiographs. FINDINGS: There is no evidence of fracture or dislocation. Calcification adjacent to greater tuberosity, likely rotator cuff  calcific tendinitis. IMPRESSION: No acute fracture or dislocation. Findings of rotator cuff calcific tendinitis. Electronically Signed   By: Mitzi HansenLance  Furusawa-Stratton M.D.   On: 03/15/2017 20:47    Procedures Procedures (including critical care time)  Medications Ordered in ED Medications  ibuprofen (ADVIL,MOTRIN) tablet 400 mg (400 mg Oral Given 03/15/17 1913)  methocarbamol (ROBAXIN) tablet 500 mg (500 mg Oral Given 03/16/17 0032)  ketorolac (TORADOL) injection 60 mg (60 mg Intramuscular Given 03/16/17 0033)     Initial Impression / Assessment and Plan / ED Course  I have reviewed the triage vital signs and the nursing notes.  Pertinent labs & imaging results that were available during my care of the patient were reviewed by me and considered in my medical decision making (see chart for details).     Patient X-Ray negative for obvious fracture or dislocation. Pain managed in ED. Pt advised to follow up with orthopedics if symptoms persist for possibility of soft tissue injury. Patient  given brace while in ED, conservative therapy recommended and discussed. Patient will be dc home & is agreeable with above plan.  The patient was discussed with and seen by Dr. Nicanor Alcon who agrees with the treatment plan.    Final Clinical Impressions(s) / ED Diagnoses   Final diagnoses:  Acute pain of right shoulder  Fall, initial encounter    ED Discharge Orders        Ordered    naproxen (NAPROSYN) 500 MG tablet  2 times daily with meals     03/16/17 0045    methocarbamol (ROBAXIN) 500 MG tablet  2 times daily     03/16/17 0045       Shareena Nusz, Dahlia Client, PA-C 03/16/17 0125    Palumbo, April, MD 03/16/17 [phone]

## 2017-03-16 NOTE — ED Notes (Signed)
Pt w/ unsteady gait refusing wheelchair.  Pt is belligerent, refusing care.  Stating, "I want my results so I can get out of here."

## 2018-09-30 ENCOUNTER — Encounter (HOSPITAL_COMMUNITY): Payer: Self-pay

## 2018-09-30 ENCOUNTER — Emergency Department (HOSPITAL_COMMUNITY)
Admission: EM | Admit: 2018-09-30 | Discharge: 2018-09-30 | Discharge status: Against Medical Advice | Payer: Medicare HMO | Attending: Emergency Medicine | Admitting: Emergency Medicine

## 2018-09-30 ENCOUNTER — Other Ambulatory Visit: Payer: Self-pay

## 2018-09-30 DIAGNOSIS — Z5321 Procedure and treatment not carried out due to patient leaving prior to being seen by health care provider: Secondary | ICD-10-CM | POA: Insufficient documentation

## 2018-09-30 DIAGNOSIS — R109 Unspecified abdominal pain: Secondary | ICD-10-CM | POA: Diagnosis present

## 2018-09-30 LAB — CBC
HCT: 41 % (ref 36.0–46.0)
Hemoglobin: 12.7 g/dL (ref 12.0–15.0)
MCH: 26.2 pg (ref 26.0–34.0)
MCHC: 31 g/dL (ref 30.0–36.0)
MCV: 84.5 fL (ref 80.0–100.0)
Platelets: 281 10*3/uL (ref 150–400)
RBC: 4.85 MIL/uL (ref 3.87–5.11)
RDW: 14.2 % (ref 11.5–15.5)
WBC: 9.2 10*3/uL (ref 4.0–10.5)
nRBC: 0 % (ref 0.0–0.2)

## 2018-09-30 LAB — URINALYSIS, ROUTINE W REFLEX MICROSCOPIC
Bilirubin Urine: NEGATIVE
Glucose, UA: NEGATIVE mg/dL
Hgb urine dipstick: NEGATIVE
Ketones, ur: 80 mg/dL — AB
Leukocytes,Ua: NEGATIVE
Nitrite: NEGATIVE
Protein, ur: NEGATIVE mg/dL
Specific Gravity, Urine: 1.028 (ref 1.005–1.030)
pH: 5 (ref 5.0–8.0)

## 2018-09-30 LAB — COMPREHENSIVE METABOLIC PANEL
ALT: 18 U/L (ref 0–44)
AST: 17 U/L (ref 15–41)
Albumin: 4.3 g/dL (ref 3.5–5.0)
Alkaline Phosphatase: 75 U/L (ref 38–126)
Anion gap: 9 (ref 5–15)
BUN: 9 mg/dL (ref 8–23)
CO2: 26 mmol/L (ref 22–32)
Calcium: 9.4 mg/dL (ref 8.9–10.3)
Chloride: 103 mmol/L (ref 98–111)
Creatinine, Ser: 0.76 mg/dL (ref 0.44–1.00)
GFR calc Af Amer: >60 mL/min (ref >60)
GFR calc non Af Amer: >60 mL/min (ref >60)
Glucose, Bld: 116 mg/dL — ABNORMAL HIGH (ref 70–99)
Potassium: 3.3 mmol/L — ABNORMAL LOW (ref 3.5–5.1)
Sodium: 138 mmol/L (ref 135–145)
Total Bilirubin: 0.6 mg/dL (ref 0.3–1.2)
Total Protein: 7.3 g/dL (ref 6.5–8.1)

## 2018-09-30 LAB — LIPASE, BLOOD: Lipase: 30 U/L (ref 11–51)

## 2018-09-30 MED ORDER — SODIUM CHLORIDE 0.9% FLUSH: 3.0000 mL | Freq: Once | INTRAVENOUS | Status: DC

## 2018-09-30 NOTE — ED Notes (Signed)
No answer

## 2018-09-30 NOTE — ED Triage Notes (Signed)
Pt states that she ate at [employer] and [employer] yesterday and had has diarrhea and nausea/vomiting since.

## 2018-10-14 ENCOUNTER — Emergency Department (HOSPITAL_COMMUNITY): Payer: No Typology Code available for payment source

## 2018-10-14 ENCOUNTER — Encounter (HOSPITAL_COMMUNITY): Payer: Self-pay

## 2018-10-14 ENCOUNTER — Emergency Department (HOSPITAL_COMMUNITY)
Admission: EM | Admit: 2018-10-14 | Discharge: 2018-10-14 | Disposition: A | Discharge status: Home / Self Care | Payer: No Typology Code available for payment source | Attending: Emergency Medicine | Admitting: Emergency Medicine

## 2018-10-14 ENCOUNTER — Other Ambulatory Visit: Payer: Self-pay

## 2018-10-14 DIAGNOSIS — R519 Headache, unspecified: Secondary | ICD-10-CM

## 2018-10-14 DIAGNOSIS — R51 Headache: Secondary | ICD-10-CM | POA: Diagnosis not present

## 2018-10-14 DIAGNOSIS — Z79899 Other long term (current) drug therapy: Secondary | ICD-10-CM | POA: Insufficient documentation

## 2018-10-14 DIAGNOSIS — Z8669 Personal history of other diseases of the nervous system and sense organs: Secondary | ICD-10-CM

## 2018-10-14 DIAGNOSIS — G43909 Migraine, unspecified, not intractable, without status migrainosus: Secondary | ICD-10-CM | POA: Diagnosis not present

## 2018-10-14 DIAGNOSIS — H9201 Otalgia, right ear: Secondary | ICD-10-CM | POA: Diagnosis present

## 2018-10-14 LAB — CBC WITH DIFFERENTIAL/PLATELET
Abs Immature Granulocytes: 0.01 10*3/uL (ref 0.00–0.07)
Basophils Absolute: 0 10*3/uL (ref 0.0–0.1)
Basophils Relative: 1 %
Eosinophils Absolute: 0.2 10*3/uL (ref 0.0–0.5)
Eosinophils Relative: 3 %
HCT: 42.2 % (ref 36.0–46.0)
Hemoglobin: 12.8 g/dL (ref 12.0–15.0)
Immature Granulocytes: 0 %
Lymphocytes Relative: 42 %
Lymphs Abs: 2.6 10*3/uL (ref 0.7–4.0)
MCH: 26.4 pg (ref 26.0–34.0)
MCHC: 30.3 g/dL (ref 30.0–36.0)
MCV: 87 fL (ref 80.0–100.0)
Monocytes Absolute: 0.4 10*3/uL (ref 0.1–1.0)
Monocytes Relative: 6 %
Neutro Abs: 3 10*3/uL (ref 1.7–7.7)
Neutrophils Relative %: 48 %
Platelets: 288 10*3/uL (ref 150–400)
RBC: 4.85 MIL/uL (ref 3.87–5.11)
RDW: 15.1 % (ref 11.5–15.5)
WBC: 6.2 10*3/uL (ref 4.0–10.5)
nRBC: 0 % (ref 0.0–0.2)

## 2018-10-14 LAB — BASIC METABOLIC PANEL
Anion gap: 12 (ref 5–15)
BUN: 14 mg/dL (ref 8–23)
CO2: 23 mmol/L (ref 22–32)
Calcium: 8.8 mg/dL — ABNORMAL LOW (ref 8.9–10.3)
Chloride: 104 mmol/L (ref 98–111)
Creatinine, Ser: 0.76 mg/dL (ref 0.44–1.00)
GFR calc Af Amer: >60 mL/min (ref >60)
GFR calc non Af Amer: >60 mL/min (ref >60)
Glucose, Bld: 108 mg/dL — ABNORMAL HIGH (ref 70–99)
Potassium: 3.6 mmol/L (ref 3.5–5.1)
Sodium: 139 mmol/L (ref 135–145)

## 2018-10-14 MED ORDER — DIPHENHYDRAMINE HCL 50 MG/ML IJ SOLN
12.5000 mg | Freq: Once | INTRAMUSCULAR | Status: AC
  Administered 2018-10-14: 12.5 mg via INTRAVENOUS
  Filled 2018-10-14: qty 1

## 2018-10-14 MED ORDER — SODIUM CHLORIDE 0.9 % IV BOLUS
1000.0000 mL | Freq: Once | INTRAVENOUS | Status: AC
  Administered 2018-10-14: 1000 mL via INTRAVENOUS

## 2018-10-14 MED ORDER — ACETAMINOPHEN 500 MG PO TABS
1000.0000 mg | ORAL_TABLET | Freq: Once | ORAL | Status: DC
  Filled 2018-10-14: qty 2

## 2018-10-14 MED ORDER — DEXAMETHASONE SODIUM PHOSPHATE 10 MG/ML IJ SOLN
10.0000 mg | Freq: Once | INTRAMUSCULAR | Status: AC
  Administered 2018-10-14: 10 mg via INTRAVENOUS
  Filled 2018-10-14: qty 1

## 2018-10-14 MED ORDER — METOCLOPRAMIDE HCL 5 MG/ML IJ SOLN
10.0000 mg | Freq: Once | INTRAMUSCULAR | Status: AC
  Administered 2018-10-14: 10 mg via INTRAVENOUS
  Filled 2018-10-14: qty 2

## 2018-10-14 NOTE — Discharge Instructions (Signed)
Please see the information and instructions below regarding your visit.  Your diagnoses today include:  1. Frontal headache   2. History of migraine     You were seen and treated in the emergency department today for headache. Fortunately, your vitals, exam, and work-up is reassuring with no apparent emergent cause for your headache at this time.  Tests performed today include: See side panel of your discharge paperwork for testing performed today. Vital signs are listed at the bottom of these instructions.   Medications prescribed:    Try to avoid daily or regular use of tylenol, aspirin, ibuprofen, and other overt-the-counter pain medications as this can contribute to rebound headaches.   Take any prescribed medications only as prescribed, and any over the counter medications only as directed on the packaging.  Home care instructions:   Drink plenty of fluids at home. This will help with your headache. Be cautious with caffeine use, as this can cause your headache to rebound when the effects wear off. If you drink more than 2 cups of coffee/caffeinated tea, or caffeinated soda per day, I suggest you wean down that amount.  Please follow any educational materials contained in this packet.   Follow-up instructions: Please follow-up with your primary care provider in the next couple of weeks for further evaluation of your symptoms if they are not completely improved.   Return instructions:  Please return to the Emergency Department if you experience worsening symptoms. It is VERY important that you monitor your symptoms at home. If you develop worsening headache, new fever, new neck stiffness, rash, focal weakness or numbness, or any other new or concerning symptoms, please return to the ED immediately, as these may be signs that your headache has become a potentially serious and life-threatening condition.  Please return if you have any other emergent concerns.  Additional  Information:   Your vital signs today were: BP (!) 138/96 (BP Location: Right Arm)    Pulse 70    Temp 98.7 F (37.1 C) (Oral)    Resp 16    Ht 5\' 9"  (1.753 m)    Wt 82.6 kg    SpO2 99%    BMI 26.88 kg/m  If your blood pressure (BP) was elevated on multiple readings during this visit above 130 for the top number or above 80 for the bottom number, please have this repeated by your primary care provider within one month. --------------  Thank you for allowing [place] to participate in your care today.

## 2018-10-14 NOTE — ED Provider Notes (Signed)
[facility]-EMERGENCY DEPT Provider Note   CSN: [id number] Arrival date & time: 10/14/18  1650     History   Chief Complaint Chief Complaint  Patient presents with  . Otalgia  . Migraine    HPI Carmen Farrell is a [age] y.o. female.     HPI  Patient is a 63 year old female with past medical history of migraine headaches, arthritis, meningioma, PTSD presenting for frontal headache and right otalgia.  Patient reports that she has a long history of headaches and today's headache feels consistent with prior but slightly worse.  She reports it began insidiously yesterday.  She reports photophobia, intermittent scotomas and nausea associated with the pain.  Denies weakness or numbness.  Denies vertigo or diplopia.  Patient denies vomiting.  Patient reports that she recently recovered from gastroenteritis and was afraid to take sumatriptan for fear that it might irritate her stomach.  Of note, patient reports that during her course of gastroenteritis she had some blood in her stool which has since resolved.  Her primary care provider is aware and they will follow her for this.  Patient does have a family history of aneurysm in her maternal Gma. Last imaging of meningioma was last year and it was stable.   Past Medical History:  Diagnosis Date  . Arthritis   . Brain lesion   . Migraine   . PTSD (post-traumatic stress disorder)     Patient Active Problem List   Diagnosis Date Noted  . Migraine   . Brain lesion     Past Surgical History:  Procedure Laterality Date  . TUBAL LIGATION       OB History   No obstetric history on file.      Home Medications    Prior to Admission medications   Medication Sig Start Date End Date Taking? Authorizing Provider  albuterol (PROVENTIL HFA;VENTOLIN HFA) 108 (90 BASE) MCG/ACT inhaler Inhale 1-2 puffs into the lungs every 6 (six) hours as needed for wheezing or shortness of breath.    [provider]   albuterol-ipratropium (COMBIVENT) 18-103 MCG/ACT inhaler Inhale 1-2 puffs into the lungs every 4 (four) hours as needed for wheezing or shortness of breath.    [provider]  cetirizine (ZYRTEC) 10 MG tablet Take 10 mg by mouth daily.    [provider]  Cholecalciferol 1000 UNITS TBDP Take 1 tablet by mouth daily.    [provider]  citalopram (CELEXA) 40 MG tablet Take 40 mg by mouth daily.    [provider]  etodolac (LODINE) 400 MG tablet Take 400 mg by mouth 2 (two) times daily.    [provider]  flunisolide (NASALIDE) 25 MCG/ACT (0.025%) SOLN Place 2 sprays into the nose 2 (two) times daily.    [provider]  methocarbamol (ROBAXIN) 500 MG tablet Take 1 tablet (500 mg total) 2 (two) times daily by mouth. 03/16/17   Muthersbaugh, Dahlia ClientHannah, PA-C  naproxen (NAPROSYN) 500 MG tablet Take 1 tablet (500 mg total) 2 (two) times daily with a meal by mouth. 03/16/17   Muthersbaugh, Dahlia ClientHannah, PA-C  oxyCODONE-acetaminophen (PERCOCET/ROXICET) 5-325 MG tablet Take 2 tablets by mouth every 4 (four) hours as needed for severe pain. Patient not taking: Reported on 03/16/2017 12/06/16   Elson AreasSofia, Leslie K, PA-C  promethazine (PHENERGAN) 25 MG tablet Take 1 tablet (25 mg total) by mouth every 4 (four) hours as needed for nausea or vomiting. Patient not taking: Reported on 03/16/2017 12/06/16   Cheron SchaumannSofia, Leslie  K, PA-C  SUMAtriptan (IMITREX) 50 MG tablet Take 50 mg by mouth every 2 (two) hours as needed for migraine or headache. May repeat in 2 hours if headache persists or recurs.    [provider]    Family History History reviewed. No pertinent family history.  Social History Social History   Tobacco Use  . Smoking status: Never Smoker  . Smokeless tobacco: Never Used  Substance Use Topics  . Alcohol use: No  . Drug use: No     Allergies   Talwin [pentazocine], Hydrocodone-acetaminophen, Penicillins, and Tylenol [acetaminophen]    Review of Systems Review of Systems  Constitutional: Negative for chills and fever.  HENT: Positive for ear pain. Negative for congestion, rhinorrhea, sinus pain and sore throat.   Eyes: Positive for photophobia. Negative for visual disturbance.  Respiratory: Negative for cough, chest tightness and shortness of breath.   Cardiovascular: Negative for chest pain, palpitations and leg swelling.  Gastrointestinal: Positive for nausea. Negative for abdominal pain, constipation, diarrhea and vomiting.  Genitourinary: Negative for dysuria.  Musculoskeletal: Negative for myalgias.  Skin: Negative for rash.  Neurological: Positive for headaches. Negative for dizziness, syncope and light-headedness.     Physical Exam Updated Vital Signs BP (!) 138/96 (BP Location: Right Arm)   Pulse 70   Temp 98.7 F (37.1 C) (Oral)   Resp 16   Ht 5\' 9"  (1.753 m)   Wt 82.6 kg   SpO2 99%   BMI 26.88 kg/m   Physical Exam Vitals signs and nursing note reviewed.  Constitutional:      General: She is not in acute distress.    Appearance: She is well-developed.  HENT:     Head: Normocephalic and atraumatic.     Right Ear: Tympanic membrane normal.     Left Ear: Tympanic membrane normal.     Ears:     Comments: Right ear without tenderness to palpation of pinna's, tragus, or mastoid.  Right tympanic membrane without erythema, or effusion. Left ear without tenderness to palpation of pinna's, tragus, or mastoid.Left TM membrane without evidence of erythema, effusion. Patient does have tenderness over bilateral TMJ joints. No tenderness over temporal arteries.     Mouth/Throat:     Mouth: Mucous membranes are moist.  Eyes:     Conjunctiva/sclera: Conjunctivae normal.     Pupils: Pupils are equal, round, and reactive to light.  Neck:     Musculoskeletal: Normal range of motion and neck supple.  Cardiovascular:     Rate and Rhythm: Normal rate and regular rhythm.     Heart sounds: S1 normal and S2  normal. No murmur.  Pulmonary:     Effort: Pulmonary effort is normal.     Breath sounds: Normal breath sounds. No wheezing or rales.  Abdominal:     General: There is no distension.     Palpations: Abdomen is soft.     Tenderness: There is no abdominal tenderness. There is no guarding.  Musculoskeletal: Normal range of motion.        General: No deformity.  Lymphadenopathy:     Cervical: No cervical adenopathy.  Skin:    General: Skin is warm and dry.     Findings: No erythema or rash.  Neurological:     Mental Status: She is alert.     Comments: Mental Status:  Alert, oriented, thought content appropriate, able to give a coherent history. Speech fluent without evidence of aphasia. Able to follow 2 step commands without difficulty.  Cranial  Nerves:  II:  Peripheral visual fields grossly normal, pupils equal, round, reactive to light III,IV, VI: ptosis not present, extra-ocular motions intact bilaterally  V,VII: smile symmetric, facial light touch sensation equal VIII: hearing grossly normal to voice  X: uvula elevates symmetrically  XI: bilateral shoulder shrug symmetric and strong XII: midline tongue extension without fassiculations Motor:  Normal tone. 5/5 in upper and lower extremities bilaterally including strong and equal grip strength and dorsiflexion/plantar flexion Sensory: Pinprick and light touch normal in all extremities.  Deep Tendon Reflexes: 2+ and symmetric in the biceps and patella. No clonus. Cerebellar: normal finger-to-nose with bilateral upper extremities Gait: normal gait and balance Stance: Romberg negative. No pronator drift and good coordination, strength, and position sense with tapping of bilateral arms (performed in sitting position). CV: distal pulses palpable throughout    Psychiatric:        Behavior: Behavior normal.        Thought Content: Thought content normal.        Judgment: Judgment normal.      ED Treatments / Results  Labs (all  labs ordered are listed, but only abnormal results are displayed) Labs Reviewed  BASIC METABOLIC PANEL - Abnormal; Notable for the following components:      Result Value   Glucose, Bld 108 (*)    Calcium 8.8 (*)    All other components within normal limits  CBC WITH DIFFERENTIAL/PLATELET    EKG    Radiology No results found.  Procedures Procedures (including critical care time)  Medications Ordered in ED Medications  sodium chloride 0.9 % bolus 1,000 mL (has no administration in time range)  metoCLOPramide (REGLAN) injection 10 mg (has no administration in time range)  diphenhydrAMINE (BENADRYL) injection 12.5 mg (has no administration in time range)  acetaminophen (TYLENOL) tablet 1,000 mg (has no administration in time range)  dexamethasone (DECADRON) injection 10 mg (has no administration in time range)     Initial Impression / Assessment and Plan / ED Course  I have reviewed the triage vital signs and the nursing notes.  Pertinent labs & imaging results that were available during my care of the patient were reviewed by me and considered in my medical decision making (see chart for details).  Clinical Course as of Oct 14 2026  Thu Oct 14, 2018  1953 Reassessed. Feels much better.    [AM]    Clinical Course User Index [AM] Elisha PonderMurray, Alyssa B, PA-C       Patient without high-risk features of headache including: sudden onset/thunderclap HA, no similar headache in past, altered mental status, accompanying seizure, headache with exertion, age > 4650, history of immunocompromise, neck or shoulder pain, fever, use of anticoagulation, personal history of aneurysm on imaging, concomitant drug use, toxic exposure.   Patient has a normal complete neurological exam, normal vital signs, normal level of consciousness, no signs of meningismus, is well-appearing/non-toxic appearing, no signs of trauma, or pain over temporal arteries.  Patient does not have any abnormal otalgic  findings.  Suspect that patient's pain is referred from her TMJ given the extent of facet wear on her teeth.  Given age over 5750, worsening headache, and history of meningioma, will obtain CT head. Labwork unremarkable.  No dangerous or life-threatening conditions suspected or identified by history, physical exam, and by work-up. No indications for hospitalization identified.  Patient has significant improvement during emergency department course.  She feels stable for discharge.  Will have patient reinitiate her abortive therapy for migraines as needed.  She is going to follow-up with primary care provider.  She is given return precautions for any new or worsening symptoms.  Patient is in understanding and agrees with the plan of care.  Final Clinical Impressions(s) / ED Diagnoses   Final diagnoses:  Frontal headache  History of migraine    ED Discharge Orders    None       Tamala Julian 10/14/18 2031    Lacretia Leigh, MD 10/14/18 2256

## 2018-10-14 NOTE — ED Provider Notes (Signed)
Medical screening examination/treatment/procedure(s) were conducted as a shared visit with non-physician practitioner(s) and myself.  I personally evaluated the patient during the encounter.    Patient here complaining of bilateral ear pain as well as her usual migraine.  Medicated here and feels better.  Will discharge home   Carmen Leigh, MD 10/14/18 2027

## 2018-10-14 NOTE — ED Triage Notes (Signed)
Patient c/o bilateral ear pain today patient c/o migraine since yesterday with nausea and sensitivity to light since yesterday.

## 2021-06-18 ENCOUNTER — Encounter (HOSPITAL_COMMUNITY): Payer: Self-pay | Admitting: Emergency Medicine

## 2021-06-18 ENCOUNTER — Other Ambulatory Visit: Payer: Self-pay

## 2021-06-18 ENCOUNTER — Emergency Department (HOSPITAL_COMMUNITY)
Admission: EM | Admit: 2021-06-18 | Discharge: 2021-06-18 | Disposition: A | Discharge status: Home / Self Care | Payer: No Typology Code available for payment source | Attending: Emergency Medicine | Admitting: Emergency Medicine

## 2021-06-18 ENCOUNTER — Emergency Department (HOSPITAL_COMMUNITY): Payer: No Typology Code available for payment source

## 2021-06-18 ENCOUNTER — Ambulatory Visit: Payer: Self-pay

## 2021-06-18 DIAGNOSIS — Y9301 Activity, walking, marching and hiking: Secondary | ICD-10-CM | POA: Insufficient documentation

## 2021-06-18 DIAGNOSIS — M7989 Other specified soft tissue disorders: Secondary | ICD-10-CM | POA: Insufficient documentation

## 2021-06-18 DIAGNOSIS — W01198A Fall on same level from slipping, tripping and stumbling with subsequent striking against other object, initial encounter: Secondary | ICD-10-CM | POA: Diagnosis not present

## 2021-06-18 DIAGNOSIS — S8992XA Unspecified injury of left lower leg, initial encounter: Secondary | ICD-10-CM | POA: Diagnosis present

## 2021-06-18 MED ORDER — OXYCODONE HCL 5 MG PO TABS: 5.0000 mg | ORAL_TABLET | Freq: Four times a day (QID) | ORAL | 0 refills | Status: AC | PRN

## 2021-06-18 MED ORDER — OXYCODONE HCL 5 MG PO TABS
5.0000 mg | ORAL_TABLET | Freq: Once | ORAL | Status: AC
  Administered 2021-06-18: 5 mg via ORAL
  Filled 2021-06-18: qty 1

## 2021-06-18 NOTE — ED Triage Notes (Signed)
Patient lost her balance and "twisted" her left knee this evening , denies LOC/respirations unlabored, reports pain at left anterior knee area with swelling , she is wearing a knee brace at arrival .

## 2021-06-18 NOTE — ED Provider Notes (Signed)
[facility] EMERGENCY DEPARTMENT Provider Note   CSN: [id number] Arrival date & time: 06/18/21  1937     History  Chief Complaint  Patient presents with   Knee Injury    Carmen Farrell is a 66 y.o. female.  Patient with history of osteoarthritis presents to the emergency department for evaluation of left knee pain.  Patient states that her knee gave out earlier tonight while she was walking.  The medial aspect of her knee hit a door frame causing her to twist her knee and fall.  She states that she felt and heard a pop when she twisted her knee.  This occurred around 5 PM today.  She did not hit her head and denies headache or neck pain.  She then developed pain and swelling.  She was unable to bear weight.  Patient had a hinged knee brace leftover from prior meniscal surgery and has been wearing this since the incident.  She complains of pain with movement and continued inability to bear weight.  No distal numbness or tingling.  Pain has shot down into her lower leg at times, especially when she puts pressure on her foot.  No lower back pain.      Home Medications Prior to Admission medications   Medication Sig Start Date End Date Taking? Authorizing Provider  albuterol (PROVENTIL HFA;VENTOLIN HFA) 108 (90 BASE) MCG/ACT inhaler Inhale 1-2 puffs into the lungs every 6 (six) hours as needed for wheezing or shortness of breath.    [provider]  albuterol-ipratropium (COMBIVENT) 18-103 MCG/ACT inhaler Inhale 1-2 puffs into the lungs every 4 (four) hours as needed for wheezing or shortness of breath.    [provider]  cetirizine (ZYRTEC) 10 MG tablet Take 10 mg by mouth daily.    [provider]  Cholecalciferol 1000 UNITS TBDP Take 1 tablet by mouth daily.    [provider]  flunisolide (NASALIDE) 25 MCG/ACT (0.025%) SOLN Place 2 sprays into the nose 2 (two) times daily.    [provider]  ibuprofen (ADVIL) 200  MG tablet Take 400 mg by mouth daily as needed for headache.    [provider]  promethazine (PHENERGAN) 25 MG tablet Take 1 tablet (25 mg total) by mouth every 4 (four) hours as needed for nausea or vomiting. Patient not taking: Reported on 03/16/2017 12/06/16 10/14/18  Elson Areas, PA-C      Allergies    Talwin [pentazocine], Hydrocodone-acetaminophen, Penicillins, and Tylenol [acetaminophen]    Review of Systems   Review of Systems  Physical Exam Updated Vital Signs BP (!) 127/91 (BP Location: Left Arm)    Pulse 84    Temp 97.6 F (36.4 C) (Oral)    Resp 18    SpO2 100%   Physical Exam Vitals and nursing note reviewed.  Constitutional:      Appearance: She is well-developed.  HENT:     Head: Normocephalic and atraumatic.  Eyes:     Pupils: Pupils are equal, round, and reactive to light.  Cardiovascular:     Pulses: Normal pulses. No decreased pulses.  Musculoskeletal:        General: Tenderness present.     Cervical back: Normal range of motion and neck supple.     Left upper leg: No tenderness.     Right knee: No swelling. Normal range of motion. No tenderness.     Left knee: Swelling (Mild) present. No effusion. Decreased range of motion. Tenderness present over the  medial joint line.     Left lower leg: Tenderness present. No swelling. No edema.     Left ankle: No tenderness. Normal range of motion.     Comments: Tenderness over the medial knee  Skin:    General: Skin is warm and dry.  Neurological:     Mental Status: She is alert.     Sensory: No sensory deficit.     Comments: Motor, sensation, and vascular distal to the injury is fully intact.   Psychiatric:        Mood and Affect: Mood normal.    ED Results / Procedures / Treatments   Labs (all labs ordered are listed, but only abnormal results are displayed) Labs Reviewed - No data to display  EKG None  Radiology DG Tibia/Fibula Left  Result Date: 06/18/2021 CLINICAL DATA:  Injury.  Fall,  heard a pop. EXAM: LEFT TIBIA AND FIBULA - 2 VIEW COMPARISON:  None. FINDINGS: Cortical margins of the tibia and fibula are intact. There is no evidence of fracture or other focal bone lesions. Ankle alignment is maintained. Soft tissues are unremarkable. IMPRESSION: No fracture of the left lower leg. Electronically Signed   By: Narda Rutherford M.D.   On: 06/18/2021 20:50   DG Knee Complete 4 Views Left  Result Date: 06/18/2021 CLINICAL DATA:  Injury.  Fall, heard a pop. EXAM: LEFT KNEE - COMPLETE 4+ VIEW COMPARISON:  Remote knee radiograph 01/29/2012 FINDINGS: No acute fracture or dislocation. There is tricompartmental peripheral spurring. Bipartite patella incidentally noted. Possible 9 mm ossified intra-articular body in the lateral joint space. No significant knee joint effusion. IMPRESSION: 1. No acute fracture or dislocation of the left knee. 2. Mild tricompartmental osteoarthritis. 3. Possible 9 mm ossified intra-articular body. Electronically Signed   By: Narda Rutherford M.D.   On: 06/18/2021 20:48    Procedures Procedures    Medications Ordered in ED Medications  oxyCODONE (Oxy IR/ROXICODONE) immediate release tablet 5 mg (5 mg Oral Given 06/18/21 2213)    ED Course/ Medical Decision Making/ A&P    Patient seen and examined. History obtained directly from patient. Work-up including labs, imaging, EKG ordered in triage, if performed, were reviewed.    Labs/EKG: None ordered  Imaging: Independently reviewed and interpreted.  This included: X-ray of the knee and tib-fib, agree negative no fracture.  Medications/Fluids: Ordered: P.o. oxycodone for pain control.  Most recent vital signs reviewed and are as follows: BP (!) 151/99    Pulse 71    Temp 98.8 F (37.1 C) (Oral)    Resp 16    SpO2 100%   Initial impression: Acute left knee pain, knee injury  Home treatment plan: We will provide with crutches and knee immobilizer.  We will give #6 oxycodone 5 mg for home.  Patient  counseled on use of narcotic pain medications.  Urged not to drink alcohol, drive, or perform any other activities that requires focus while taking these medications. The patient verbalizes understanding and agrees with the plan.  Return instructions discussed with patient: Uncontrolled pain, worsening weakness.  Follow-up instructions discussed with patient: Follow-up with her orthopedist at the [place].                           Medical Decision Making Risk Prescription drug management.   Patient here with knee injury.  X-rays negative for fracture.  Lower extremity neurovascularly intact.  Considered ligamentous injury, meniscal injury.  Patient is able to extend  knee slightly and I have low concern for patellar or quadriceps tendon rupture.  Patella appears to be appropriately placed on exam and on imaging.        Final Clinical Impression(s) / ED Diagnoses Final diagnoses:  Injury of left knee, initial encounter    Rx / DC Orders ED Discharge Orders          Ordered    oxyCODONE (OXY IR/ROXICODONE) 5 MG immediate release tablet  Every 6 hours PRN        06/18/21 2220              Renne Crigler, PA-C 06/18/21 2320    Alvira Monday, MD 06/20/21 2215

## 2021-06-18 NOTE — Telephone Encounter (Signed)
°  Chief Complaint: Extreme knee pain Symptoms: pain10/10 Frequency: just started Pertinent Negatives: Patient denies fever Disposition: [] ED /[] Urgent Care (no appt availability in office) / [] Appointment(In office/virtual)/ []  [facility] Virtual [facility]/ [] Home Care/ [] Refused Recommended Disposition /[] [facility]/ []  Follow-up with PCP Additional Notes: Pt was walking and heard pop, now cannot walk. Pt already uses a cane. Pt has multiple knee injuries already.       Reason for Disposition  Can't stand (bear weight) or walk  A "snap" or "pop" was heard at the time of injury  Answer Assessment - Initial Assessment Questions 1. MECHANISM: "How did the injury happen?" (e.g., twisting injury, direct blow)      Walking 2. ONSET: "When did the injury happen?" (Minutes or hours ago)      A bit ago 3. LOCATION: "Where is the injury located?"      Left knee 4. APPEARANCE of INJURY: "What does the injury look like?"       5. SEVERITY: "Can you put weight on that leg?" "Can you walk?"      no 6. SIZE: For cuts, bruises, or swelling, ask: "How large is it?" (e.g., inches or centimeters;  entire joint)      na 7. PAIN: "Is there pain?" If Yes, ask: "How bad is the pain?"  "What does it keep you from doing?" (e.g., Scale 1-10; or mild, moderate, severe)   -  NONE: (0): no pain   -  MILD (1-3): doesn't interfere with normal activities    -  MODERATE (4-7): interferes with normal activities (e.g., work or school) or awakens from sleep, limping    -  SEVERE (8-10): excruciating pain, unable to do any normal activities, unable to walk     10 8. TETANUS: For any breaks in the skin, ask: "When was the last tetanus booster?"     no 9. OTHER SYMPTOMS: "Do you have any other symptoms?"  (e.g., "pop" when knee injured, swelling, locking, buckling)      Previous knee injury 10. PREGNANCY: "Is there any chance you are pregnant?" "When was your last menstrual period?"       na  Protocols  used: Knee Injury-A-AH

## 2021-06-18 NOTE — Discharge Instructions (Signed)
Please read and follow all provided instructions.  Your diagnoses today include:  1. Injury of left knee, initial encounter     Tests performed today include: An x-ray of the affected area - does NOT show any broken bones Vital signs. See below for your results today.   Medications prescribed:  Oxycodone - narcotic pain medication  DO NOT drive or perform any activities that require you to be awake and alert because this medicine can make you drowsy.   Take any prescribed medications only as directed.  Home care instructions:  Follow any educational materials contained in this packet Follow R.I.C.E. Protocol: R - rest your injury  I  - use ice on injury without applying directly to skin C - compress injury with bandage or splint E - elevate the injury as much as possible  Follow-up instructions: Please follow-up with your primary care provider or your orthopedic physician (bone specialist) in 1 week.   Return instructions:  Please return if your toes or feet are numb or tingling, appear gray or blue, or you have severe pain (also elevate the leg and loosen splint or wrap if you were given one) Please return to the Emergency Department if you experience worsening symptoms.  Please return if you have any other emergent concerns.  Additional Information:  Your vital signs today were: BP (!) 151/99    Pulse 71    Temp 98.8 F (37.1 C) (Oral)    Resp 16    SpO2 100%  If your blood pressure (BP) was elevated above 135/85 this visit, please have this repeated by your doctor within one month. --------------

## 2021-06-18 NOTE — ED Provider Triage Note (Signed)
Emergency Medicine Provider Triage Evaluation Note  Carmen Farrell , a 66 y.o. female  was evaluated in triage.  Pt complains of left knee pain.  Patient states that she started experiencing left knee pain about 2 to 3 hours ago.  She thinks she banged her leg against the door, however she does not completely remember the incident because she is so anxious right now.  She complains of pain from the left anterior lower leg up into the left knee.  She has associated swelling.  She is currently already in a knee brace.  She has a history of multiple ligamental injuries and has had surgery on this in the past back in [place].  Review of Systems  Positive:  Negative:   Physical Exam  BP (!) 127/91 (BP Location: Left Arm)    Pulse 84    Temp 97.6 F (36.4 C) (Oral)    Resp 18    SpO2 100%  Gen:   Awake, no distress   Resp:  Normal effort  MSK:   Moves extremities without difficulty  Other:  Left knee with some swelling noted.  Patient is already immobilized in a brace and is not willing to bend knee.  She has 2+ tibial dorsalis pulses bilaterally.  She has normal sensation distal to the injury.  Pain is present from the mid anterior calf up until the knee.  Patient has significant pain with just light touch.  Medical Decision Making  Medically screening exam initiated at 8:02 PM.  Appropriate orders placed.  Carmen Farrell was informed that the remainder of the evaluation will be completed by another provider, this initial triage assessment does not replace that evaluation, and the importance of remaining in the ED until their evaluation is complete.  Plain films of the knee and tibia-fibula will be obtained.   Therese Sarah 06/18/21 2004

## 2021-06-18 NOTE — ED Notes (Signed)
Dc instructions reviewed with pt. PT verbalized understanding. Pt DC.  °

## 2023-10-18 IMAGING — DX DG TIBIA/FIBULA 2V*L*
4 series · 4 of 4 positions shown · non-contrast
Comparison: None.

CLINICAL DATA: Injury.  Fall, heard a pop.

EXAM:
LEFT TIBIA AND FIBULA - 2 VIEW

[tibia ap (1 of 2)]
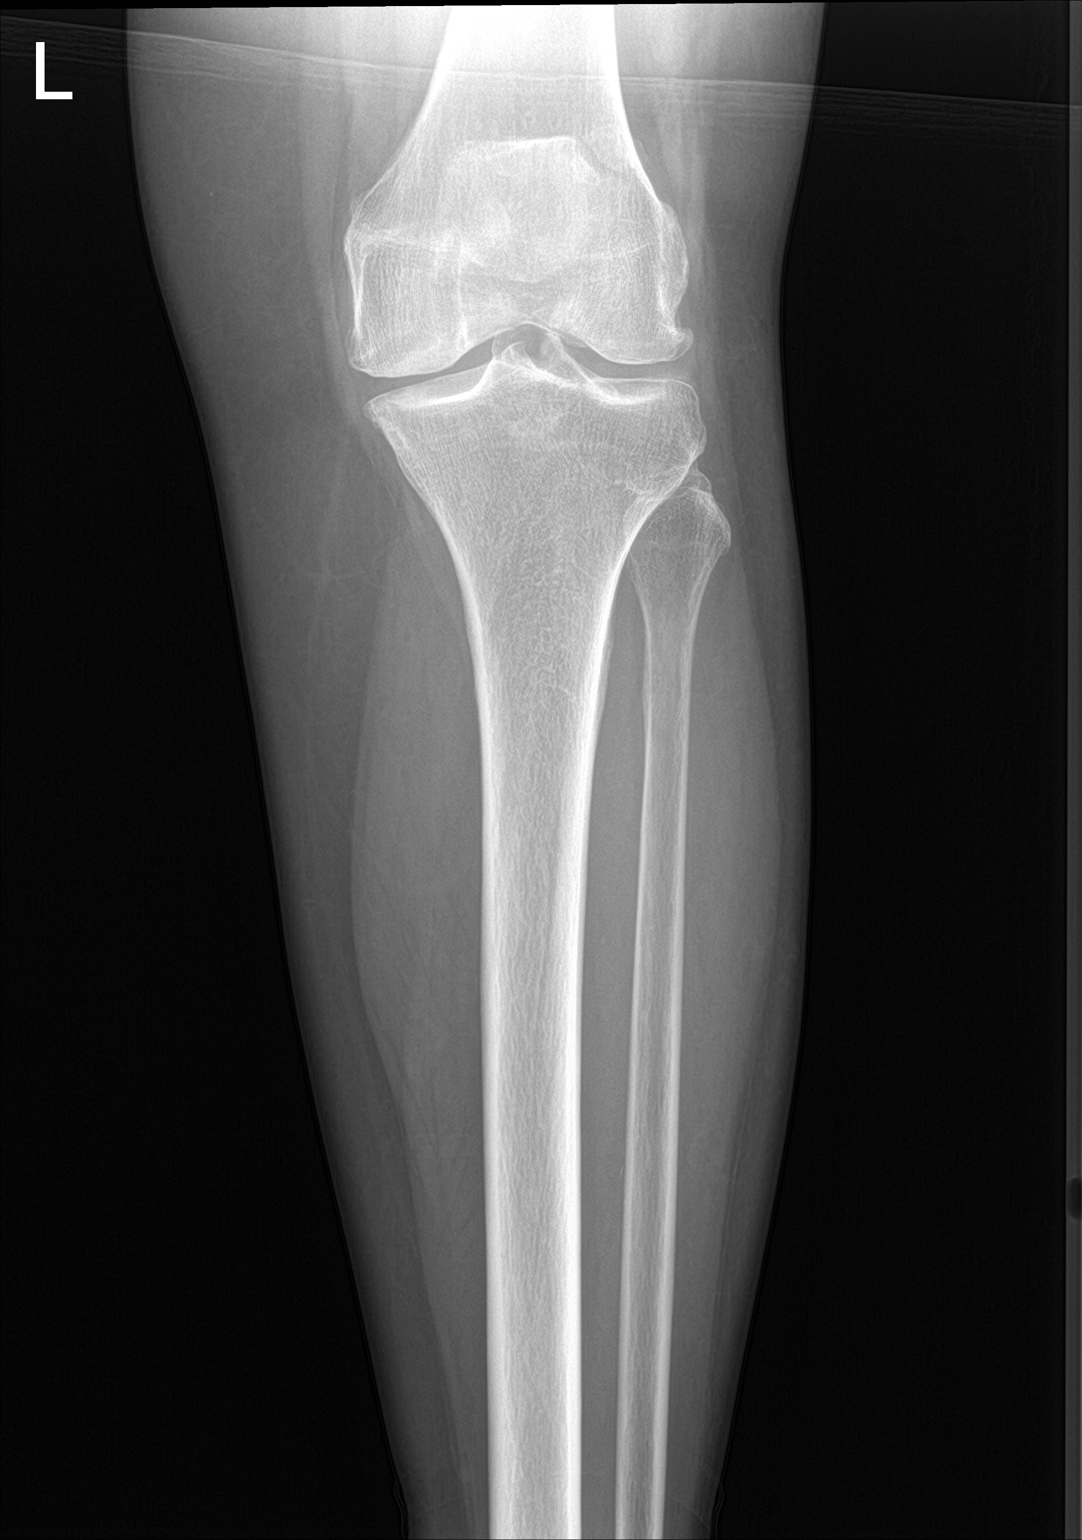

[tibia ap (2 of 2)]
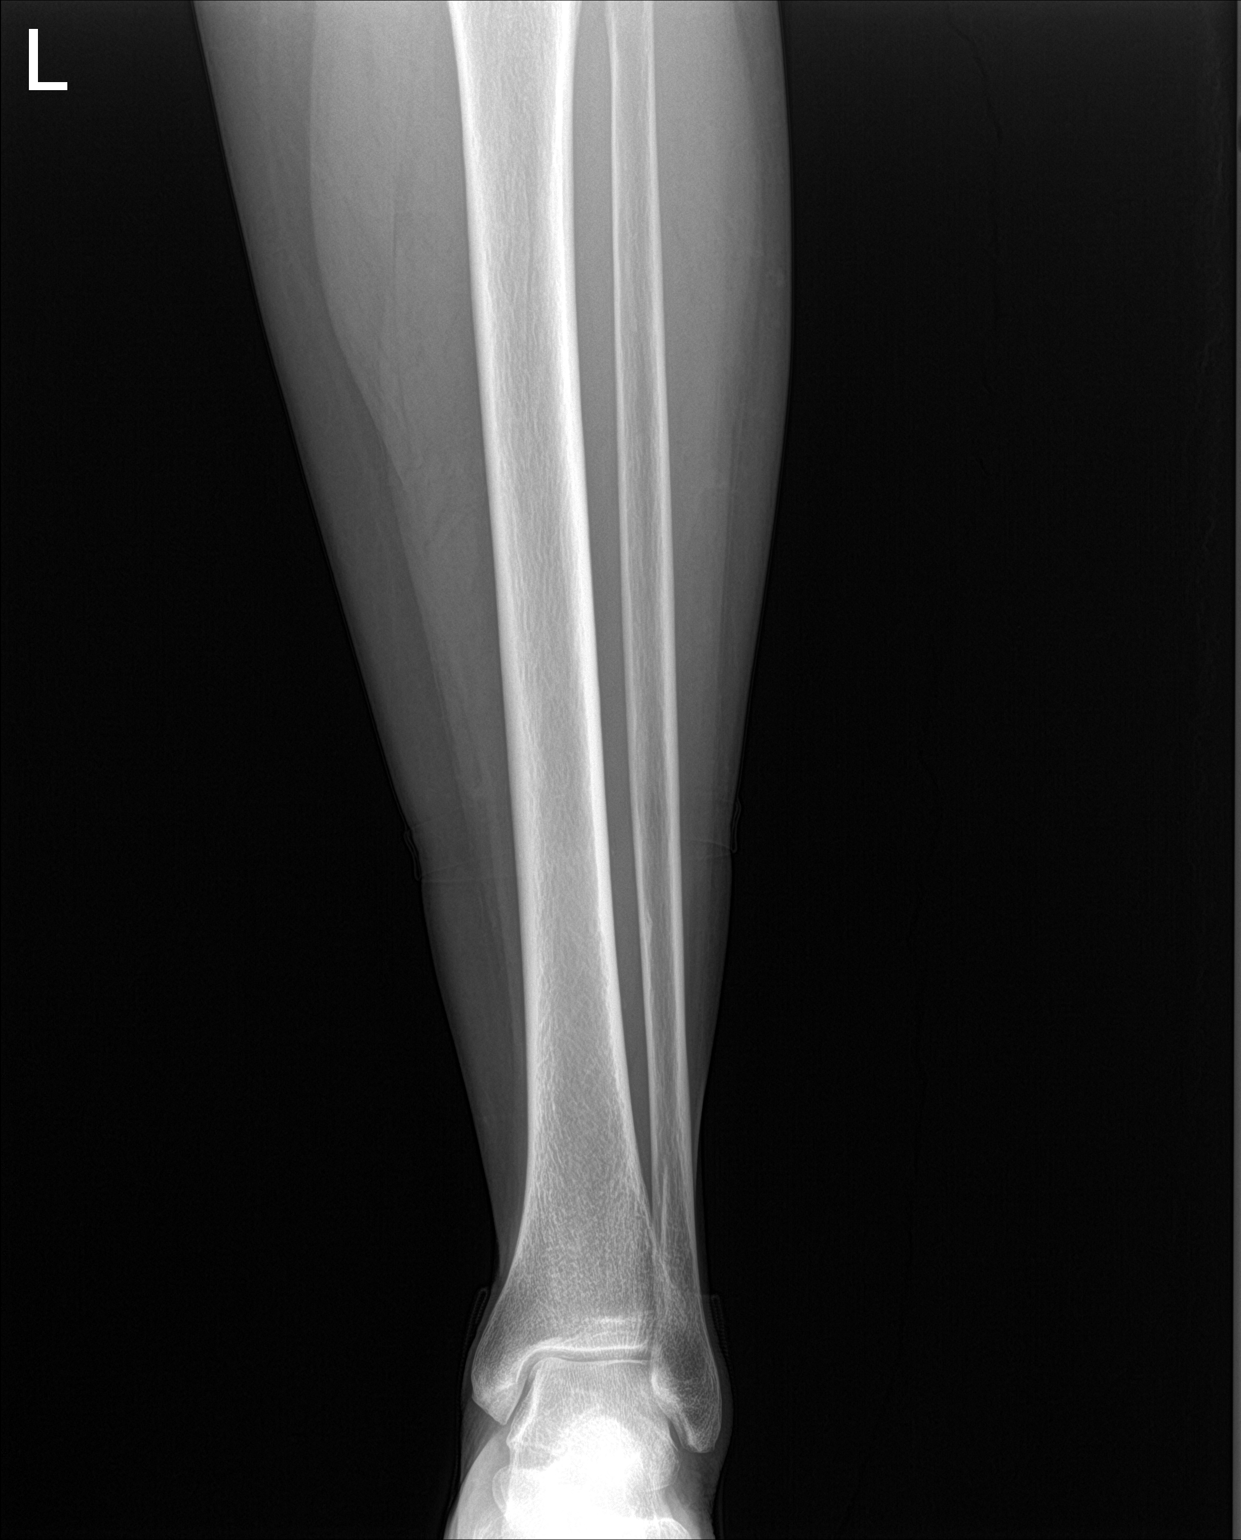

[tibia lat (1 of 2)]
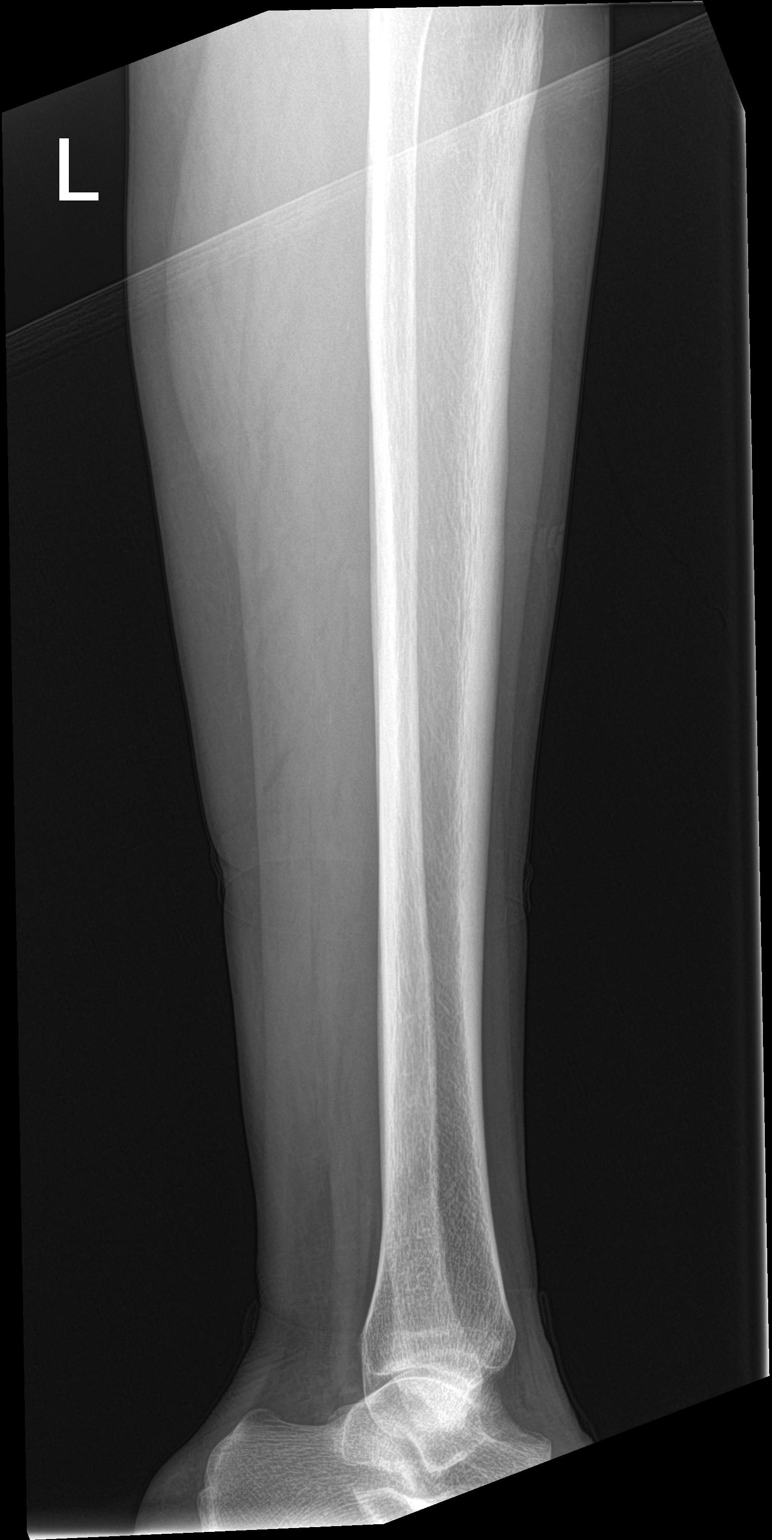

[tibia lat (2 of 2)]
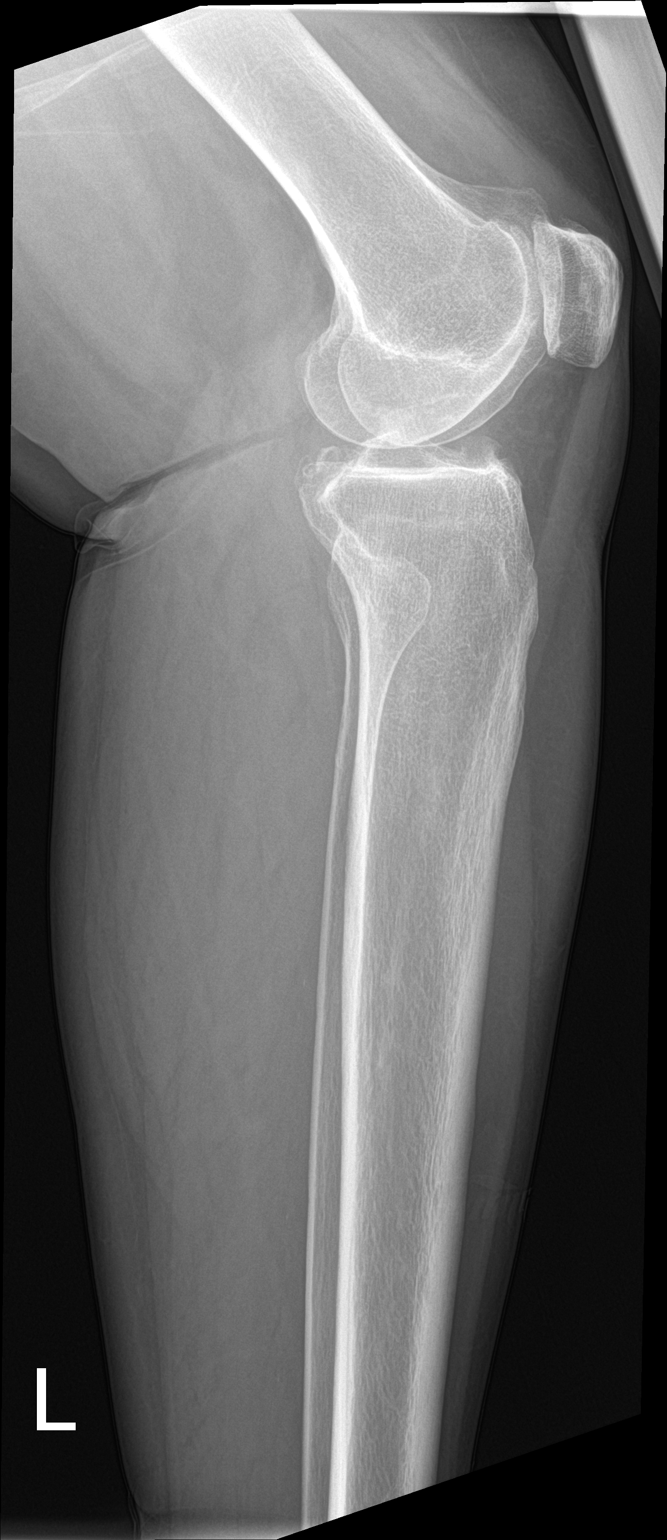

[4 of 4 positions shown; findings below may reference images not displayed]

FINDINGS: Cortical margins of the tibia and fibula are intact. There is no
evidence of fracture or other focal bone lesions. Ankle alignment is
maintained. Soft tissues are unremarkable.
IMPRESSION: No fracture of the left lower leg.
